# Patient Record
Sex: Female | Born: 1970 | Race: White | Hispanic: No | Marital: Single | Smoking: Never smoker
Health system: Southern US, Community
[De-identification: ages and names within clinical notes are randomized; demographics above are authoritative.]

## PROBLEM LIST (undated history)

## (undated) DIAGNOSIS — Z8489 Family history of other specified conditions: Secondary | ICD-10-CM

## (undated) DIAGNOSIS — J342 Deviated nasal septum: Secondary | ICD-10-CM

## (undated) DIAGNOSIS — G40009 Localization-related (focal) (partial) idiopathic epilepsy and epileptic syndromes with seizures of localized onset, not intractable, without status epilepticus: Secondary | ICD-10-CM

## (undated) HISTORY — PX: NASAL SEPTOPLASTY W/ TURBINOPLASTY: SHX2070

## (undated) HISTORY — PX: WISDOM TOOTH EXTRACTION: SHX21

---

## 1998-09-01 ENCOUNTER — Other Ambulatory Visit: Admission: RE | Admit: 1998-09-01 | Discharge: 1998-09-01 | Payer: Self-pay | Admitting: Obstetrics and Gynecology

## 1999-01-29 ENCOUNTER — Other Ambulatory Visit: Admission: RE | Admit: 1999-01-29 | Discharge: 1999-01-29 | Payer: Self-pay | Admitting: Obstetrics and Gynecology

## 1999-08-18 ENCOUNTER — Inpatient Hospital Stay (HOSPITAL_COMMUNITY): Admission: AD | Admit: 1999-08-18 | Discharge: 1999-08-20 | Payer: Self-pay | Admitting: Obstetrics and Gynecology

## 1999-08-19 ENCOUNTER — Encounter: Payer: Self-pay | Admitting: Obstetrics and Gynecology

## 1999-09-22 ENCOUNTER — Other Ambulatory Visit: Admission: RE | Admit: 1999-09-22 | Discharge: 1999-09-22 | Payer: Self-pay | Admitting: Obstetrics and Gynecology

## 2000-10-07 ENCOUNTER — Other Ambulatory Visit: Admission: RE | Admit: 2000-10-07 | Discharge: 2000-10-07 | Payer: Self-pay | Admitting: Obstetrics and Gynecology

## 2018-11-01 ENCOUNTER — Ambulatory Visit: Payer: BC Managed Care – PPO | Admitting: Allergy

## 2018-11-01 ENCOUNTER — Encounter: Payer: Self-pay | Admitting: Allergy

## 2018-11-01 VITALS — BP 110/72 | HR 86 | Temp 98.2°F | Resp 20 | Ht 64.0 in | Wt 208.2 lb

## 2018-11-01 DIAGNOSIS — J3089 Other allergic rhinitis: Secondary | ICD-10-CM | POA: Diagnosis not present

## 2018-11-01 DIAGNOSIS — H6993 Unspecified Eustachian tube disorder, bilateral: Secondary | ICD-10-CM

## 2018-11-01 DIAGNOSIS — R05 Cough: Secondary | ICD-10-CM | POA: Diagnosis not present

## 2018-11-01 DIAGNOSIS — H6983 Other specified disorders of Eustachian tube, bilateral: Secondary | ICD-10-CM

## 2018-11-01 DIAGNOSIS — R059 Cough, unspecified: Secondary | ICD-10-CM

## 2018-11-01 DIAGNOSIS — J310 Chronic rhinitis: Secondary | ICD-10-CM | POA: Insufficient documentation

## 2018-11-01 MED ORDER — LEVALBUTEROL TARTRATE 45 MCG/ACT IN AERO: INHALATION_SPRAY | RESPIRATORY_TRACT | 1 refills | Status: DC

## 2018-11-01 MED ORDER — PANTOPRAZOLE SODIUM 40 MG PO TBEC: 40.0000 mg | DELAYED_RELEASE_TABLET | Freq: Every day | ORAL | 2 refills | Status: DC

## 2018-11-01 NOTE — Progress Notes (Signed)
New Patient Note  RE: Susan Jefferson MRN: [id number] DOB: 1971/06/02 Date of Office Visit: 11/01/2018  Referring provider: No ref. provider found Primary care provider: Patient, No Pcp Per  Chief Complaint: New Patient (Initial Visit) (?allergic asthma ) and Cough (8 months out of yr )  History of Present Illness: I had the pleasure of seeing Shanitra Phillippi for initial evaluation at the [facility] on 11/02/2018. She is a 47 y.o. female, who is self-referred here for the evaluation of allergies and coughing.   Coughing:  She reports symptoms of chest tightness, shortness of breath, coughing, wheezing for 20 years. This happens from fall through spring but is symptom free during the summer. Current medications include albuterol prn which help but cause palpitations. She reports not using aerochamber with inhalers. She tried the following inhalers: none. Main triggers are unknown but better at night and worse after meals. No history of reflux and never been on any heartburn medication.  In the last month, frequency of symptoms: daily. Frequency of nocturnal symptoms: 0x/month. Frequency of SABA use: daily but it causes palpitations. Interference with physical activity: yes. Sleep is undisturbed. In the last 12 months, emergency room visits/urgent care visits/doctor office visits or hospitalizations due to respiratory issues: once. In the last 12 months, oral steroids courses: no Lifetime history of hospitalization for respiratory issues: no. Prior intubations: no. History of pneumonia: no. She was not evaluated by allergist/pulmonologist in the past. Smoking exposure: no. Up to date with flu vaccine: no.  She reports symptoms of nasal congestion, ear pain, sinus pressure. Symptoms have been going on for 20 years. The symptoms are present fall through spring. Anosmia: yes at times. Headache: yes. She has used zyrtec, Claritin, Flonase 1 spray QD with fair improvement in  symptoms. Sinus infections: no. Previous work up includes: none. Previous ENT evaluation: no. Previous sinus imaging: no.   Assessment and Plan: Ayse is a 47 y.o. female with: Coughing Respiratory symptoms for the 20 years from the fall through spring, symptom free during the summer. Tried albuterol with good benefit but causes palpitations. Worse after meals but better at night. Denies heartburn symptoms.  The most common causes of chronic cough include the following: upper airway cough syndrome (UACS) which is caused by variety of rhinitis conditions; asthma; gastroesophageal reflux disease (GERD); chronic bronchitis from cigarette smoking or other inhaled environmental irritants; non-asthmatic eosinophilic bronchitis; and bronchiectasis. In prospective studies, these conditions have accounted for up to 94% of the causes of chronic cough in immunocompetent adults.  The history and physical examination suggest that her cough is multifactorial. Today's spirometry showed: possible restrictive disease and no improvement post bronchodilator treatment.   Will try to get xopenex prior authorized. Patient had palpitations with albuterol.   May use rescue inhaler 2 puffs or nebulizer every 4 to 6 hours as needed for shortness of breath, chest tightness, coughing, and wheezing. May use rescue inhaler 2 puffs 5 to 15 minutes prior to strenuous physical activities.  Start Protonix 40mg  daily and provided materials on lifestyle modifications regarding heartburn and focus on minimizing spicy food intake.  Other allergic rhinitis Rhinitis symptoms for the past 20 years from fall through spring. Tried zyrtec, Claritin and Flonase with some benefit. No previous ENT evaluation.   Today's testing was negative to environmental allergies including the positive control questioning the validity of the results.  Get bloodwork and will make additional recommendations based on results.   Restart Flonase 2  sprays daily.  May use over the counter antihistamines such as Zyrtec (cetirizine), Claritin (loratadine), Allegra (fexofenadine), or Xyzal (levocetirizine) daily as needed.  Eustachian tube dysfunction, bilateral Patient most likely has eustachian tube dysfunction as well. Sometimes nasal steroid sprays can help with this.  Continue Flonase 2 sprays daily and monitor symptoms.   Return in about 2 months (around 01/01/2019).  Meds ordered this encounter  Medications  . pantoprazole (PROTONIX) 40 MG tablet    Sig: Take 1 tablet (40 mg total) by mouth daily.    Dispense:  30 tablet    Refill:  2  . levalbuterol (XOPENEX HFA) 45 MCG/ACT inhaler    Sig: Use 2 puffs every 4-6 hours as needed for shortness of breath, chest tightness, cough or wheezing    Dispense:  15 g    Refill:  1    Lab Orders     Allergens Zone 2  Other allergy screening: Food allergy: no Medication allergy: no Hymenoptera allergy: no Urticaria: maybe Eczema:no History of recurrent infections suggestive of immunodeficency: no  Diagnostics: Spirometry:  Tracings reviewed. Her effort: It was hard to get consistent efforts and there is a question as to whether this reflects a maximal maneuver. FVC: 2.43L FEV1: 2.16L, 74% predicted FEV1/FVC ratio: 89% Interpretation: Spirometry consistent with possible restrictive disease and no improvement post bronchodilator treatment.  Please see scanned spirometry results for details.  Skin Testing: Environmental allergy panel. Negative test to: environmental allergies including positive control questioning validity of the results.  Results discussed with patient/family. Airborne Adult Perc - 11/01/18 1453    Time Antigen Placed  1450    Allergen Manufacturer  Waynette Buttery    Location  Back    Number of Test  59    Panel 1  Select    1. Control-Buffer 50% Glycerol  Negative    2. Control-Histamine 1 mg/ml  Negative    3. Albumin saline  Negative    4. Bahia  Negative     5. French Southern Territ[place]  Negative    6. Johnson  Negative    7. Kentucky Blue  Negative    8. Meadow Fescue  Negative    9. Perennial Rye  Negative    10. Sweet Vernal  Negative    11. Timothy  Negative    12. Cocklebur  Negative    13. Burweed Marshelder  Negative    14. Ragweed, short  Negative    15. Ragweed, Giant  Negative    16. Plantain,  English  Negative    17. Lamb's Quarters  Negative    18. Sheep Sorrell  Negative    19. Rough Pigweed  Negative    20. Marsh Elder, Rough  Negative    21. Mugwort, Common  Negative    22. Ash mix  Negative    23. Birch mix  Negative    24. Beech American  Negative    25. Box, Elder  Negative    26. Cedar, red  Negative    27. Cottonwood, Guinea-Bissau  Negative    28. Elm mix  Negative    29. Hickory mix  Negative    30. Maple mix  Negative    31. Oak, Guinea-Bissau mix  Negative    32. Pecan Pollen  Negative    33. Pine mix  Negative    34. Sycamore Eastern  Negative    35. Walnut, Black Pollen  Negative    36. Alternaria alternata  Negative    37. Cladosporium Herbarum  Negative  38. Aspergillus mix  Negative    39. Penicillium mix  Negative    40. Bipolaris sorokiniana (Helminthosporium)  Negative    41. Drechslera spicifera (Curvularia)  Negative    42. Mucor plumbeus  Negative    43. Fusarium moniliforme  Negative    44. Aureobasidium pullulans (pullulara)  Negative    45. Rhizopus oryzae  Negative    46. Botrytis cinera  Negative    47. Epicoccum nigrum  Negative    48. Phoma betae  Negative    49. Candida Albicans  Negative    50. Trichophyton mentagrophytes  Negative    51. Mite, D Farinae  5,000 AU/ml  Negative    52. Mite, D Pteronyssinus  5,000 AU/ml  Negative    53. Cat Hair 10,000 BAU/ml  Negative    54.  Dog Epithelia  Negative    55. Mixed Feathers  Negative    56. Horse Epithelia  Negative    57. Cockroach, German  Negative    58. Mouse  Negative    59. Tobacco Leaf  Negative       Past Medical History: Patient Active  Problem List   Diagnosis Date Noted  . Eustachian tube dysfunction, bilateral 11/02/2018  . Coughing 11/01/2018  . Other allergic rhinitis 11/01/2018   History reviewed. No pertinent past medical history. Past Surgical History: History reviewed. No pertinent surgical history. Medication List:  Current Outpatient Medications  Medication Sig Dispense Refill  . albuterol (PROVENTIL HFA;VENTOLIN HFA) 108 (90 Base) MCG/ACT inhaler INHALE 1 TO 2 PUFFS PO Q 4 TO 8 H PRF WHEEZING.  0  . escitalopram (LEXAPRO) 10 MG tablet TK 1 T PO QD  3  . fluticasone (FLONASE) 50 MCG/ACT nasal spray Place into both nostrils daily.    . Loratadine (CLARITIN PO) Take 10 mg by mouth.    . mirtazapine (REMERON) 15 MG tablet     . levalbuterol (XOPENEX HFA) 45 MCG/ACT inhaler Use 2 puffs every 4-6 hours as needed for shortness of breath, chest tightness, cough or wheezing 15 g 1  . pantoprazole (PROTONIX) 40 MG tablet Take 1 tablet (40 mg total) by mouth daily. 30 tablet 2   No current facility-administered medications for this visit.    Allergies: No Known Allergies Social History: Social History   Socioeconomic History  . Marital status: Married    Spouse name: Not on file  . Number of children: Not on file  . Years of education: Not on file  . Highest education level: Not on file  Occupational History  . Not on file  Social Needs  . Financial resource strain: Not on file  . Food insecurity:    Worry: Not on file    Inability: Not on file  . Transportation needs:    Medical: Not on file    Non-medical: Not on file  Tobacco Use  . Smoking status: Never Smoker  . Smokeless tobacco: Never Used  Substance and Sexual Activity  . Alcohol use: Never    Frequency: Never  . Drug use: Never  . Sexual activity: Not on file  Lifestyle  . Physical activity:    Days per week: Not on file    Minutes per session: Not on file  . Stress: Not on file  Relationships  . Social connections:    Talks on  phone: Not on file    Gets together: Not on file    Attends religious service: Not on file    Active  member of club or organization: Not on file    Attends meetings of clubs or organizations: Not on file    Relationship status: Not on file  Other Topics Concern  . Not on file  Social History Narrative  . Not on file   Lives in a 546 month old townhome. Smoking: denies Occupation: Adult nurseteacher  Environmental HistorySurveyor, minerals: Water Damage/mildew in the house: no Engineer, civil (consulting)Carpet in the family room: no Carpet in the bedroom: yes Heating: gas Cooling: central Pet: yes 2 dogs x 11 yrs, 3-4 yrs  Family History: Family History  Problem Relation Age of Onset  . Allergic rhinitis Son   . Asthma Neg Hx   . Eczema Neg Hx   . Urticaria Neg Hx    Problem                               Relation Asthma                                   No  Eczema                                No  Food allergy                          No  Allergic rhino conjunctivitis     Son   Review of Systems  Constitutional: Negative for appetite change, chills, fever and unexpected weight change.  HENT: Positive for congestion and sinus pressure. Negative for rhinorrhea.   Eyes: Negative for itching.  Respiratory: Positive for cough, chest tightness, shortness of breath and wheezing.   Cardiovascular: Negative for chest pain.  Gastrointestinal: Negative for abdominal pain.  Genitourinary: Negative for difficulty urinating.  Skin: Negative for rash.  Allergic/Immunologic: Positive for environmental allergies. Negative for food allergies.  Neurological: Positive for headaches.   Objective: BP 110/72 (BP Location: Left Arm, Patient Position: Sitting, Cuff Size: Normal)   Pulse 86   Temp 98.2 F (36.8 C) (Oral)   Resp 20   Ht 5\' 4"  (1.626 m)   Wt 208 lb 3.2 oz (94.4 kg)   SpO2 96%   BMI 35.74 kg/m  Body mass index is 35.74 kg/m. Physical Exam  Constitutional: She is oriented to person, place, and time. She appears  well-developed and well-nourished.  HENT:  Head: Normocephalic and atraumatic.  Right Ear: External ear normal.  Left Ear: External ear normal.  Nose: Nose normal.  Mouth/Throat: Oropharynx is clear and moist.  Eyes: Conjunctivae and EOM are normal.  Neck: Neck supple.  Cardiovascular: Normal rate, regular rhythm and normal heart sounds. Exam reveals no gallop and no friction rub.  No murmur heard. Pulmonary/Chest: Effort normal and breath sounds normal. She has no wheezes. She has no rales.  Abdominal: Soft.  Lymphadenopathy:    She has no cervical adenopathy.  Neurological: She is alert and oriented to person, place, and time.  Skin: Skin is warm. No rash noted.  Psychiatric: She has a normal mood and affect. Her behavior is normal.  Nursing note and vitals reviewed.  The plan was reviewed with the patient/family, and all questions/concerned were addressed.  It was my pleasure to see Imelda today and participate in her care. Please feel free to contact me with any  questions or concerns.  Sincerely,  Wyline Mood, DO Allergy & Immunology  [facility] office: [phone] [facility] office:[phone]

## 2018-11-01 NOTE — Patient Instructions (Addendum)
Today's testing was negative to environmental allergies including the positive control questioning the validity of the results.  Get bloodwork and will make additional recommendations based on results.   Will try to get xopenex prior authorized. Patient had palpitations with albuterol.  May use rescue inhaler 2 puffs or nebulizer every 4 to 6 hours as needed for shortness of breath, chest tightness, coughing, and wheezing. May use rescue inhaler 2 puffs 5 to 15 minutes prior to strenuous physical activities.  Restart Flonase 2 sprays daily. May use over the counter antihistamines such as Zyrtec (cetirizine), Claritin (loratadine), Allegra (fexofenadine), or Xyzal (levocetirizine) daily as needed.  Take protonix 40mg  daily.   Follow up in 2 months

## 2018-11-02 DIAGNOSIS — H6983 Other specified disorders of Eustachian tube, bilateral: Secondary | ICD-10-CM | POA: Insufficient documentation

## 2018-11-02 NOTE — Assessment & Plan Note (Signed)
Rhinitis symptoms for the past 20 years from fall through spring. Tried zyrtec, Claritin and Flonase with some benefit. No previous ENT evaluation.   Today's testing was negative to environmental allergies including the positive control questioning the validity of the results.  Get bloodwork and will make additional recommendations based on results.   Restart Flonase 2 sprays daily.  May use over the counter antihistamines such as Zyrtec (cetirizine), Claritin (loratadine), Allegra (fexofenadine), or Xyzal (levocetirizine) daily as needed.

## 2018-11-02 NOTE — Assessment & Plan Note (Signed)
Patient most likely has eustachian tube dysfunction as well. Sometimes nasal steroid sprays can help with this.  Continue Flonase 2 sprays daily and monitor symptoms.

## 2018-11-02 NOTE — Assessment & Plan Note (Signed)
Respiratory symptoms for the 20 years from the fall through spring, symptom free during the summer. Tried albuterol with good benefit but causes palpitations. Worse after meals but better at night. Denies heartburn symptoms.  The most common causes of chronic cough include the following: upper airway cough syndrome (UACS) which is caused by variety of rhinitis conditions; asthma; gastroesophageal reflux disease (GERD); chronic bronchitis from cigarette smoking or other inhaled environmental irritants; non-asthmatic eosinophilic bronchitis; and bronchiectasis. In prospective studies, these conditions have accounted for up to 94% of the causes of chronic cough in immunocompetent adults.  The history and physical examination suggest that her cough is multifactorial. Today's spirometry showed: possible restrictive disease and no improvement post bronchodilator treatment.   Will try to get xopenex prior authorized. Patient had palpitations with albuterol.   May use rescue inhaler 2 puffs or nebulizer every 4 to 6 hours as needed for shortness of breath, chest tightness, coughing, and wheezing. May use rescue inhaler 2 puffs 5 to 15 minutes prior to strenuous physical activities.  Start Protonix 40mg  daily and provided materials on lifestyle modifications regarding heartburn and focus on minimizing spicy food intake.

## 2018-11-04 LAB — IGE+ALLERGENS ZONE 2(30)
Alternaria Alternata IgE: <0.1 kU/L
Amer Sycamore IgE Qn: <0.1 kU/L
Aspergillus Fumigatus IgE: <0.1 kU/L
Bahia Grass IgE: <0.1 kU/L
Bermuda Grass IgE: <0.1 kU/L
Cat Dander IgE: <0.1 kU/L
Cedar, Mountain IgE: <0.1 kU/L
Cladosporium Herbarum IgE: <0.1 kU/L
Cockroach, American IgE: <0.1 kU/L
Common Silver Birch IgE: <0.1 kU/L
D Farinae IgE: <0.1 kU/L
D Pteronyssinus IgE: <0.1 kU/L
Dog Dander IgE: <0.1 kU/L
Elm, American IgE: <0.1 kU/L
Hickory, White IgE: <0.1 kU/L
IgE (Immunoglobulin E), Serum: <2 IU/mL — ABNORMAL LOW (ref 6–495)
Johnson Grass IgE: <0.1 kU/L
Maple/Box Elder IgE: <0.1 kU/L
Mucor Racemosus IgE: <0.1 kU/L
Mugwort IgE Qn: <0.1 kU/L
Nettle IgE: <0.1 kU/L
Oak, White IgE: <0.1 kU/L
Penicillium Chrysogen IgE: <0.1 kU/L
Pigweed, Rough IgE: <0.1 kU/L
Plantain, English IgE: <0.1 kU/L
Ragweed, Short IgE: <0.1 kU/L
Sheep Sorrel IgE Qn: <0.1 kU/L
Stemphylium Herbarum IgE: <0.1 kU/L
Sweet gum IgE RAST Ql: <0.1 kU/L
Timothy Grass IgE: <0.1 kU/L
White Mulberry IgE: <0.1 kU/L

## 2018-12-07 ENCOUNTER — Other Ambulatory Visit: Payer: Self-pay | Admitting: Internal Medicine

## 2018-12-07 DIAGNOSIS — Z1231 Encounter for screening mammogram for malignant neoplasm of breast: Secondary | ICD-10-CM

## 2018-12-08 ENCOUNTER — Ambulatory Visit
Admission: RE | Admit: 2018-12-08 | Discharge: 2018-12-08 | Disposition: A | Discharge status: Home / Self Care | Payer: BC Managed Care – PPO | Source: Ambulatory Visit | Attending: Internal Medicine | Admitting: Internal Medicine

## 2018-12-08 ENCOUNTER — Other Ambulatory Visit: Payer: Self-pay | Admitting: Internal Medicine

## 2018-12-08 DIAGNOSIS — R0600 Dyspnea, unspecified: Secondary | ICD-10-CM

## 2018-12-08 DIAGNOSIS — R05 Cough: Secondary | ICD-10-CM

## 2018-12-08 DIAGNOSIS — R059 Cough, unspecified: Secondary | ICD-10-CM

## 2019-01-01 ENCOUNTER — Encounter: Payer: Self-pay | Admitting: Allergy

## 2019-01-01 ENCOUNTER — Telehealth: Payer: Self-pay

## 2019-01-01 ENCOUNTER — Ambulatory Visit: Payer: BC Managed Care – PPO | Admitting: Allergy

## 2019-01-01 VITALS — BP 130/82 | HR 100 | Resp 16

## 2019-01-01 DIAGNOSIS — R059 Cough, unspecified: Secondary | ICD-10-CM

## 2019-01-01 DIAGNOSIS — J0100 Acute maxillary sinusitis, unspecified: Secondary | ICD-10-CM | POA: Insufficient documentation

## 2019-01-01 DIAGNOSIS — J310 Chronic rhinitis: Secondary | ICD-10-CM

## 2019-01-01 DIAGNOSIS — R05 Cough: Secondary | ICD-10-CM | POA: Diagnosis not present

## 2019-01-01 MED ORDER — FLUTICASONE PROPIONATE 93 MCG/ACT NA EXHU: 2.0000 | INHALANT_SUSPENSION | Freq: Two times a day (BID) | NASAL | 3 refills | Status: DC

## 2019-01-01 MED ORDER — AMOXICILLIN-POT CLAVULANATE 875-125 MG PO TABS: 1.0000 | ORAL_TABLET | Freq: Two times a day (BID) | ORAL | 0 refills | Status: DC

## 2019-01-01 NOTE — Assessment & Plan Note (Addendum)
Past history - Rhinitis symptoms for the past 20 years from fall through spring. Tried zyrtec, Claritin and Flonase with some benefit. No previous ENT evaluation. 2019 testing was negative to environmental allergies including the positive control questioning the validity of the results. Interim history - 2019 immunocap was negative to environmental allergies as well. Still having same symptoms with discolored thick mucous. Did not tolerate nettipot in the past.   Question if she has a sinus infection.   Start Augmentin twice a day.   Start xhance 2 sprays twice daily - sample given, demonstrated proper use. This replaces Flonase.   Start prednisone taper.   Nasal saline spray (i.e., Simply Saline) or nasal saline lavage (i.e., NeilMed) is recommended as needed and prior to medicated nasal sprays.  Refer to ENT to rule out anatomical/structural abnormalities.   May continue Claritin daily if needed.

## 2019-01-01 NOTE — Assessment & Plan Note (Signed)
Past history - Respiratory symptoms for the 20 years from the fall through spring, symptom free during the summer. Tried albuterol with good benefit but causes palpitations. Worse after meals but better at night. Denies heartburn symptoms.  Interim history - Unchanged. Tried Protonix for 1 month with no improvement. Xopenex does not cause palpitations but does not sem to work either.  Hopefully symptoms will improve with Augmentin and prednisone. If not then consider obtaining CXR at next visit.   Monitor symptoms.

## 2019-01-01 NOTE — Patient Instructions (Addendum)
Non-allergic rhinitis  Start xhance 2 sprays twice daily - sample given, demonstrated proper use  Start prednisone taper.   Nasal saline spray (i.e., Simply Saline) or nasal saline lavage (i.e., NeilMed) is recommended as needed and prior to medicated nasal sprays.  Start Augmentin twice a day.   Refer to ENT  Follow up in 2 months

## 2019-01-01 NOTE — Assessment & Plan Note (Signed)
.   See assessment and plan as above. 

## 2019-01-01 NOTE — Progress Notes (Signed)
Follow Up Note  RE: Susan Jefferson Burnside MRN: [id number] DOB: 1971/01/01 Date of Office Visit: 01/01/2019  Referring provider: No ref. provider found Primary care provider: Renford DillsPolite, Ronald, MD  Chief Complaint: Follow-up  History of Present Illness: I had the pleasure of seeing Susan Jefferson Zuccaro for a follow up visit at the [facility] on 01/01/2019. She is a 48 y.o. female, who is being followed for coughing, allergic rhinitis, eustachian tube dysfunction. Today she is here for regular follow up visit and not feeling better. Her previous allergy office visit was on 11/01/2018 with Dr. Selena BattenKim.   Coughing Patient took Protonix for 4 weeks with no benefit.  Tried xopenex  With no benefit and it caused a coughing fits. Didn't seem to work as well as albuterol.   Other allergic rhinitis Taking Claritin which helped the rash. Still having issus with sense of taste and smell. Increased mucous production which is thick and colored. No fevers or chills.  Still happens from fall through spring and symptom free in the summer. Still taking Flonase but no much benefit. Nettipot seemed to worsen symptoms.   Assessment and Plan: [name] is a 48 y.o. female with: Non-allergic rhinitis Past history - Rhinitis symptoms for the past 20 years from fall through spring. Tried zyrtec, Claritin and Flonase with some benefit. No previous ENT evaluation. 2019 testing was negative to environmental allergies including the positive control questioning the validity of the results. Interim history - 2019 immunocap was negative to environmental allergies as well. Still having same symptoms with discolored thick mucous. Did not tolerate nettipot in the past.   Question if she has a sinus infection.   Start Augmentin twice a day.   Start xhance 2 sprays twice daily - sample given, demonstrated proper use. This replaces Flonase.   Start prednisone taper.   Nasal saline spray (i.e., Simply Saline) or  nasal saline lavage (i.e., NeilMed) is recommended as needed and prior to medicated nasal sprays.  Refer to ENT to rule out anatomical/structural abnormalities.   May continue Claritin daily if needed.  Acute maxillary sinusitis See assessment and plan as above.  Coughing Past history - Respiratory symptoms for the 20 years from the fall through spring, symptom free during the summer. Tried albuterol with good benefit but causes palpitations. Worse after meals but better at night. Denies heartburn symptoms.  Interim history - Unchanged. Tried Protonix for 1 month with no improvement. Xopenex does not cause palpitations but does not sem to work either.  Hopefully symptoms will improve with Augmentin and prednisone. If not then consider obtaining CXR at next visit.   Monitor symptoms.   Return in about 2 months (around 03/02/2019).  Meds ordered this encounter  Medications  . amoxicillin-clavulanate (AUGMENTIN) 875-125 MG tablet    Sig: Take 1 tablet by mouth 2 (two) times daily.    Dispense:  20 tablet    Refill:  0  . Fluticasone Propionate (XHANCE) 93 MCG/ACT EXHU    Sig: Place 2 sprays into the nose 2 (two) times daily.    Dispense:  16 mL    Refill:  3   Diagnostics: None.  Medication List:  Current Outpatient Medications  Medication Sig Dispense Refill  . escitalopram (LEXAPRO) 10 MG tablet TK 1 T PO QD  3  . Loratadine (CLARITIN PO) Take 10 mg by mouth.    . mirtazapine (REMERON) 15 MG tablet     . albuterol (PROVENTIL HFA;VENTOLIN HFA) 108 (90 Base) MCG/ACT inhaler  INHALE 1 TO 2 PUFFS PO Q 4 TO 8 H PRF WHEEZING.  0  . amoxicillin-clavulanate (AUGMENTIN) 875-125 MG tablet Take 1 tablet by mouth 2 (two) times daily. 20 tablet 0  . Fluticasone Propionate (XHANCE) 93 MCG/ACT EXHU Place 2 sprays into the nose 2 (two) times daily. 16 mL 3  . levalbuterol (XOPENEX HFA) 45 MCG/ACT inhaler Use 2 puffs every 4-6 hours as needed for shortness of breath, chest tightness, cough or  wheezing (Patient not taking: Reported on 01/01/2019) 15 g 1   No current facility-administered medications for this visit.    Allergies: No Known Allergies I reviewed her past medical history, social history, family history, and environmental history and no significant changes have been reported from previous visit on 11/01/2018.  Review of Systems  Constitutional: Negative for appetite change, chills, fever and unexpected weight change.  HENT: Positive for congestion and sinus pressure. Negative for rhinorrhea.   Eyes: Negative for itching.  Respiratory: Positive for cough, chest tightness and shortness of breath. Negative for wheezing.   Cardiovascular: Negative for chest pain.  Gastrointestinal: Negative for abdominal pain.  Genitourinary: Negative for difficulty urinating.  Skin: Negative for rash.  Allergic/Immunologic: Negative for environmental allergies and food allergies.  Neurological: Positive for headaches.   Objective: BP 130/82 (BP Location: Left Arm)   Pulse 100   Resp 16   SpO2 95%  There is no height or weight on file to calculate BMI. Physical Exam  Constitutional: She is oriented to person, place, and time. She appears well-developed and well-nourished.  HENT:  Head: Normocephalic and atraumatic.  Right Ear: External ear normal.  Left Ear: External ear normal.  Nose: Nose normal.  Mouth/Throat: Oropharynx is clear and moist.  Eyes: Conjunctivae and EOM are normal.  Neck: Neck supple.  Cardiovascular: Normal rate, regular rhythm and normal heart sounds. Exam reveals no gallop and no friction rub.  No murmur heard. Pulmonary/Chest: Effort normal and breath sounds normal. She has no wheezes. She has no rales.  Abdominal: Soft.  Lymphadenopathy:    She has no cervical adenopathy.  Neurological: She is alert and oriented to person, place, and time.  Skin: Skin is warm. No rash noted.  Psychiatric: She has a normal mood and affect. Her behavior is normal.    Nursing note and vitals reviewed.  Previous notes and tests were reviewed. The plan was reviewed with the patient/family, and all questions/concerned were addressed.  It was my pleasure to see Niamh today and participate in her care. Please feel free to contact me with any questions or concerns.  Sincerely,  Wyline Mood, DO Allergy & Immunology  [facility] office: [phone] [facility] office: [phone]

## 2019-01-01 NOTE — Telephone Encounter (Signed)
Can you place a referral to ENT?

## 2019-01-02 NOTE — Telephone Encounter (Signed)
Referral has been placed. 

## 2019-01-05 ENCOUNTER — Ambulatory Visit
Admission: RE | Admit: 2019-01-05 | Discharge: 2019-01-05 | Disposition: A | Discharge status: Home / Self Care | Payer: BC Managed Care – PPO | Source: Ambulatory Visit | Attending: Internal Medicine | Admitting: Internal Medicine

## 2019-01-05 DIAGNOSIS — Z1231 Encounter for screening mammogram for malignant neoplasm of breast: Secondary | ICD-10-CM

## 2019-03-01 ENCOUNTER — Ambulatory Visit (INDEPENDENT_AMBULATORY_CARE_PROVIDER_SITE_OTHER): Payer: BC Managed Care – PPO | Admitting: Allergy

## 2019-03-01 ENCOUNTER — Telehealth: Payer: Self-pay | Admitting: Allergy

## 2019-03-01 ENCOUNTER — Encounter: Payer: Self-pay | Admitting: Allergy

## 2019-03-01 ENCOUNTER — Other Ambulatory Visit: Payer: Self-pay

## 2019-03-01 DIAGNOSIS — J0100 Acute maxillary sinusitis, unspecified: Secondary | ICD-10-CM | POA: Diagnosis not present

## 2019-03-01 DIAGNOSIS — J310 Chronic rhinitis: Secondary | ICD-10-CM

## 2019-03-01 MED ORDER — PREDNISONE 10 MG PO TABS: ORAL_TABLET | ORAL | 0 refills | Status: DC

## 2019-03-01 MED ORDER — DOXYCYCLINE HYCLATE 100 MG PO CAPS: 100.0000 mg | ORAL_CAPSULE | Freq: Two times a day (BID) | ORAL | 0 refills | Status: AC

## 2019-03-01 NOTE — Telephone Encounter (Signed)
Patient is set up with a virtual visit with Dr Selena Batten this afternoon at 3:30 pm.

## 2019-03-01 NOTE — Progress Notes (Signed)
RE: Susan Jefferson MRN: [id number] DOB: 1971-05-16 Date of Telemedicine Visit: 03/01/2019  Referring provider: Renford Dills, MD Primary care provider: Renford Dills, MD  Chief Complaint: Nasal Congestion (green,sticky and stringy x since September); Sinus Problem (flonase claritin and sinus medication ); Smell; and Taste   Telemedicine Follow Up Visit via Telephone: I connected with Susan Jefferson for a follow up on 03/01/19 by telephone and verified that I am speaking with the correct person using two identifiers.   I discussed the limitations, risks, security and privacy concerns of performing an evaluation and management service by telephone and the availability of in person appointments. I also discussed with the patient that there may be a patient responsible charge related to this service. The patient expressed understanding and agreed to proceed.  Patient is a passenger in a moving car. Provider is at the office.  Visit start time: 3:29PM Visit end time: 3:45PM Insurance consent/check in by: Susan Jefferson Medical consent and medical assistant/nurse: Bennye Alm  History of Present Illness: She is a 48 y.o. female, who is being followed for coughing, allergic rhinitis, eustachian tube dysfunction. Her previous allergy office visit was on 01/01/2019 with Dr. Selena Batten.   Patient took prednisone and Augmentin in January which helped. She noted that her sense of smell and taste returned with prednisone and once she stopped prednisone the symptoms returned. The Augmentin did help with some of the nasal discharge but it never completely went away.  She called the office today as her symptoms worsened yesterday with ears are popping, feeling nauseous and sinus pressure. She called ENT to move up the appointment but was told due to the coronavirus pandemic they are actually not seeing new patients until June. She can't wait until then as her symptoms are worsening.   She is also having  green/sticky discharge and taking Mucinex for the sinus pressure with some benefit. Denies any fevers or chills.  Stopped doing [place] as she felt like the medication was not going up her sinuses.   Assessment and Plan: [name] is a 48 y.o. female with: Acute maxillary sinusitis Past history - Rhinitis symptoms for the past 20 years from fall through spring. Tried zyrtec, Claritin and Flonase with some benefit. No previous ENT evaluation. 2019 testing was negative to environmental allergies including the positive control questioning the validity of the results. 2019 immunocap was negative to environmental allergies as well.  Interim history - symptoms improved while on prednisone and Augmentin did help but once she completed medications all her symptoms returned. Unable to see ENT until June due to Coronavirus pandemic.  She most likely has another sinus infection.  Start doxycycline 100mg  twice a day for 10 days.  Start prednisone taper.   Start xhance 2 sprays twice daily.  Nasal saline spray (i.e., Simply Saline) or nasal saline lavage (i.e., NeilMed) is recommended as needed and prior to medicated nasal sprays.  May continue Claritin daily if needed.  May use mucinex twice a day with plenty of water to help with loosening up the discharge.   Follow up with ENT as scheduled.  Return in about 3 months (around 06/01/2019).  Meds ordered this encounter  Medications  . doxycycline (VIBRAMYCIN) 100 MG capsule    Sig: Take 1 capsule (100 mg total) by mouth 2 (two) times daily for 10 days.    Dispense:  20 capsule    Refill:  0  . predniSONE (DELTASONE) 10 MG tablet    Sig: Take prednisone 40mg  daily x  2 days, 30mg  daily x 2 days, 20mg  daily x 2 days and 10mg  daily x 2 days.    Dispense:  20 tablet    Refill:  0    Dx: acute sinusitis   Diagnostics: None.  Medication List:  Current Outpatient Medications  Medication Sig Dispense Refill  . albuterol (PROVENTIL HFA;VENTOLIN  HFA) 108 (90 Base) MCG/ACT inhaler INHALE 1 TO 2 PUFFS PO Q 4 TO 8 H PRF WHEEZING.  0  . escitalopram (LEXAPRO) 10 MG tablet TK 1 T PO QD  3  . fluticasone (FLONASE) 50 MCG/ACT nasal spray Place into both nostrils daily.    . Loratadine (CLARITIN PO) Take 10 mg by mouth.    . mirtazapine (REMERON) 15 MG tablet     . Phenylephrine-DM-GG-APAP (MUCINEX SINUS-MAX) 5-10-200-325 MG CAPS Take by mouth.    . doxycycline (VIBRAMYCIN) 100 MG capsule Take 1 capsule (100 mg total) by mouth 2 (two) times daily for 10 days. 20 capsule 0  . Fluticasone Propionate (XHANCE) 93 MCG/ACT EXHU Place 2 sprays into the nose 2 (two) times daily. (Patient not taking: Reported on 03/01/2019) 16 mL 3  . levalbuterol (XOPENEX HFA) 45 MCG/ACT inhaler Use 2 puffs every 4-6 hours as needed for shortness of breath, chest tightness, cough or wheezing (Patient not taking: Reported on 03/01/2019) 15 g 1  . predniSONE (DELTASONE) 10 MG tablet Take prednisone 40mg  daily x 2 days, 30mg  daily x 2 days, 20mg  daily x 2 days and 10mg  daily x 2 days. 20 tablet 0   No current facility-administered medications for this visit.    Allergies: No Known Allergies I reviewed her past medical history, social history, family history, and environmental history and no significant changes have been reported from previous visit on 01/01/2019.  Review of Systems  Constitutional: Negative for appetite change, chills, fever and unexpected weight change.  HENT: Positive for congestion, sinus pressure and sinus pain. Negative for rhinorrhea.   Eyes: Negative for itching.  Respiratory: Negative for wheezing.   Cardiovascular: Negative for chest pain.  Gastrointestinal: Negative for abdominal pain.  Genitourinary: Negative for difficulty urinating.  Skin: Negative for rash.  Allergic/Immunologic: Negative for environmental allergies and food allergies.  Neurological: Positive for headaches.   Objective: Physical Exam Not obtained as encounter was done  via telephone.   Previous notes and tests were reviewed.  I discussed the assessment and treatment plan with the patient. The patient was provided an opportunity to ask questions and all were answered. The patient agreed with the plan and demonstrated an understanding of the instructions. Patient instructions were emailed to patient.    The patient was advised to call back or seek an in-person evaluation if the symptoms worsen or if the condition fails to improve as anticipated.  I provided 16 minutes of non-face-to-face time during this encounter.  It was my pleasure to participate in Dorchester Wolden's care today. Please feel free to contact me with any questions or concerns.   Sincerely,  Wyline Mood, DO Allergy & Immunology  [facility] office: [phone] [facility] office: [phone]

## 2019-03-01 NOTE — Patient Instructions (Addendum)
   Start antibiotics called doxycycline 1 pill twice a day for 10 days.  Start prednisone taper.   Start xhance 2 sprays twice daily.  Nasal saline spray (i.e., Simply Saline) or nasal saline lavage (i.e., NeilMed) is recommended as needed and prior to medicated nasal sprays.  May continue Claritin daily if needed.  May use mucinex twice a day with plenty of water to help with loosening up the discharge.   Follow up with ENT as scheduled.  Follow up with me after ENT visit.

## 2019-03-01 NOTE — Telephone Encounter (Signed)
Patient is calling about some medication that she was given in the past that stopped her sinus drainage Patient cannot remember the name of the medication Her ENT has closed until June and she will not be able to see them for her referral Please call patient about medication  Patient uses [facility] on [address]

## 2019-03-01 NOTE — Assessment & Plan Note (Signed)
Past history - Rhinitis symptoms for the past 20 years from fall through spring. Tried zyrtec, Claritin and Flonase with some benefit. No previous ENT evaluation. 2019 testing was negative to environmental allergies including the positive control questioning the validity of the results. 2019 immunocap was negative to environmental allergies as well.  Interim history - symptoms improved while on prednisone and Augmentin did help but once she completed medications all her symptoms returned. Unable to see ENT until June due to Coronavirus pandemic.  She most likely has another sinus infection.  Start doxycycline 100mg  twice a day for 10 days.  Start prednisone taper.   Start xhance 2 sprays twice daily.  Nasal saline spray (i.e., Simply Saline) or nasal saline lavage (i.e., NeilMed) is recommended as needed and prior to medicated nasal sprays.  May continue Claritin daily if needed.  May use mucinex twice a day with plenty of water to help with loosening up the discharge.   Follow up with ENT as scheduled.

## 2019-03-01 NOTE — Telephone Encounter (Signed)
Called patient states she is having same symptoms as her appt on 12/2018 states she has runny nose sinus pressure dizziness. Denies fever body aches chills. Patient would like a prednisone and antibiotic treatment as this helped before. States her ENT appt got changed due to them closing and wont be seen until June.  Dr Selena Batten please advise

## 2019-03-01 NOTE — Telephone Encounter (Signed)
Can we set up a telephone or e-visit for this patient in the afternoon?

## 2019-03-05 ENCOUNTER — Ambulatory Visit: Payer: BC Managed Care – PPO | Admitting: Allergy

## 2019-05-24 ENCOUNTER — Other Ambulatory Visit: Payer: Self-pay | Admitting: Otolaryngology

## 2019-05-24 DIAGNOSIS — J329 Chronic sinusitis, unspecified: Secondary | ICD-10-CM

## 2019-05-25 ENCOUNTER — Ambulatory Visit
Admission: RE | Admit: 2019-05-25 | Discharge: 2019-05-25 | Disposition: A | Discharge status: Home / Self Care | Payer: BC Managed Care – PPO | Source: Ambulatory Visit | Attending: Otolaryngology | Admitting: Otolaryngology

## 2019-05-25 ENCOUNTER — Other Ambulatory Visit: Payer: Self-pay

## 2019-05-25 DIAGNOSIS — J329 Chronic sinusitis, unspecified: Secondary | ICD-10-CM

## 2019-06-07 ENCOUNTER — Other Ambulatory Visit: Payer: Self-pay | Admitting: Otolaryngology

## 2019-06-14 ENCOUNTER — Encounter (HOSPITAL_BASED_OUTPATIENT_CLINIC_OR_DEPARTMENT_OTHER): Payer: Self-pay | Admitting: *Deleted

## 2019-06-14 ENCOUNTER — Other Ambulatory Visit: Payer: Self-pay

## 2019-06-18 ENCOUNTER — Other Ambulatory Visit (HOSPITAL_COMMUNITY)
Admission: RE | Admit: 2019-06-18 | Discharge: 2019-06-18 | Disposition: A | Discharge status: Home / Self Care | Payer: BC Managed Care – PPO | Source: Ambulatory Visit | Attending: Otolaryngology | Admitting: Otolaryngology

## 2019-06-18 DIAGNOSIS — Z1159 Encounter for screening for other viral diseases: Secondary | ICD-10-CM | POA: Diagnosis not present

## 2019-06-19 LAB — SARS CORONAVIRUS 2 (TAT 6-24 HRS): SARS Coronavirus 2: NEGATIVE

## 2019-06-20 NOTE — Anesthesia Preprocedure Evaluation (Signed)
Anesthesia Evaluation  Patient identified by MRN, date of birth, ID band Patient awake    Reviewed: Allergy & Precautions, NPO status , Patient's Chart, lab work & pertinent test results  History of Anesthesia Complications (+) Family history of anesthesia reaction  Airway Mallampati: II  TM Distance: >3 FB Neck ROM: Full    Dental  (+) Dental Advisory Given   Pulmonary neg pulmonary ROS,    Pulmonary exam normal breath sounds clear to auscultation       Cardiovascular negative cardio ROS Normal cardiovascular exam Rhythm:Regular Rate:Normal     Neuro/Psych Seizures -,  negative psych ROS   GI/Hepatic negative GI ROS, Neg liver ROS,   Endo/Other  negative endocrine ROS  Renal/GU negative Renal ROS     Musculoskeletal negative musculoskeletal ROS (+)   Abdominal   Peds  Hematology negative hematology ROS (+)   Anesthesia Other Findings   Reproductive/Obstetrics negative OB ROS                             Anesthesia Physical Anesthesia Plan  ASA: II  Anesthesia Plan: General   Post-op Pain Management:    Induction: Intravenous  PONV Risk Score and Plan: 4 or greater and Ondansetron, Dexamethasone, Treatment may vary due to age or medical condition and Midazolam  Airway Management Planned: Oral ETT  Additional Equipment:   Intra-op Plan:   Post-operative Plan: Extubation in OR  Informed Consent: I have reviewed the patients History and Physical, chart, labs and discussed the procedure including the risks, benefits and alternatives for the proposed anesthesia with the patient or authorized representative who has indicated his/her understanding and acceptance.     Dental advisory given  Plan Discussed with: CRNA  Anesthesia Plan Comments:         Anesthesia Quick Evaluation

## 2019-06-21 ENCOUNTER — Ambulatory Visit (HOSPITAL_BASED_OUTPATIENT_CLINIC_OR_DEPARTMENT_OTHER)
Admission: RE | Admit: 2019-06-21 | Discharge: 2019-06-21 | Disposition: A | Discharge status: Home / Self Care | Payer: BC Managed Care – PPO | Attending: Otolaryngology | Admitting: Otolaryngology

## 2019-06-21 ENCOUNTER — Encounter (HOSPITAL_BASED_OUTPATIENT_CLINIC_OR_DEPARTMENT_OTHER): Payer: Self-pay | Admitting: Anesthesiology

## 2019-06-21 ENCOUNTER — Encounter (HOSPITAL_BASED_OUTPATIENT_CLINIC_OR_DEPARTMENT_OTHER)
Admission: RE | Disposition: A | Discharge status: Home / Self Care | Payer: Self-pay | Source: Home / Self Care | Attending: Otolaryngology

## 2019-06-21 ENCOUNTER — Ambulatory Visit (HOSPITAL_BASED_OUTPATIENT_CLINIC_OR_DEPARTMENT_OTHER): Payer: BC Managed Care – PPO | Admitting: Anesthesiology

## 2019-06-21 DIAGNOSIS — J320 Chronic maxillary sinusitis: Secondary | ICD-10-CM | POA: Insufficient documentation

## 2019-06-21 DIAGNOSIS — J342 Deviated nasal septum: Secondary | ICD-10-CM | POA: Insufficient documentation

## 2019-06-21 DIAGNOSIS — J343 Hypertrophy of nasal turbinates: Secondary | ICD-10-CM | POA: Insufficient documentation

## 2019-06-21 DIAGNOSIS — J3489 Other specified disorders of nose and nasal sinuses: Secondary | ICD-10-CM | POA: Diagnosis not present

## 2019-06-21 DIAGNOSIS — J322 Chronic ethmoidal sinusitis: Secondary | ICD-10-CM | POA: Insufficient documentation

## 2019-06-21 DIAGNOSIS — J323 Chronic sphenoidal sinusitis: Secondary | ICD-10-CM | POA: Insufficient documentation

## 2019-06-21 DIAGNOSIS — J329 Chronic sinusitis, unspecified: Secondary | ICD-10-CM | POA: Diagnosis present

## 2019-06-21 DIAGNOSIS — J338 Other polyp of sinus: Secondary | ICD-10-CM

## 2019-06-21 HISTORY — PX: SINUS ENDO WITH FUSION: SHX5329

## 2019-06-21 HISTORY — DX: Localization-related (focal) (partial) idiopathic epilepsy and epileptic syndromes with seizures of localized onset, not intractable, without status epilepticus: G40.009

## 2019-06-21 HISTORY — DX: Family history of other specified conditions: Z84.89

## 2019-06-21 HISTORY — PX: MAXILLARY ANTROSTOMY: SHX2003

## 2019-06-21 HISTORY — PX: ETHMOIDECTOMY: SHX5197

## 2019-06-21 HISTORY — DX: Deviated nasal septum: J34.2

## 2019-06-21 HISTORY — PX: NASAL SEPTOPLASTY W/ TURBINOPLASTY: SHX2070

## 2019-06-21 SURGERY — SEPTOPLASTY, NOSE, WITH NASAL TURBINATE REDUCTION
Anesthesia: General | Site: Nose | Laterality: Bilateral

## 2019-06-21 MED ORDER — OXYMETAZOLINE HCL 0.05 % NA SOLN
NASAL | Status: AC
  Filled 2019-06-21: qty 30

## 2019-06-21 MED ORDER — OXYMETAZOLINE HCL 0.05 % NA SOLN
NASAL | Status: DC | PRN
  Administered 2019-06-21: 1 via TOPICAL

## 2019-06-21 MED ORDER — BACITRACIN ZINC 500 UNIT/GM EX OINT
TOPICAL_OINTMENT | CUTANEOUS | Status: AC
  Filled 2019-06-21: qty 28.35

## 2019-06-21 MED ORDER — MIDAZOLAM HCL 2 MG/2ML IJ SOLN
INTRAMUSCULAR | Status: AC
  Filled 2019-06-21: qty 2

## 2019-06-21 MED ORDER — DIPHENHYDRAMINE HCL 50 MG/ML IJ SOLN: 6.2500 mg | Freq: Once | INTRAMUSCULAR | Status: DC

## 2019-06-21 MED ORDER — ROCURONIUM BROMIDE 100 MG/10ML IV SOLN
INTRAVENOUS | Status: DC | PRN
  Administered 2019-06-21: 60 mg via INTRAVENOUS

## 2019-06-21 MED ORDER — MUPIROCIN 2 % EX OINT
TOPICAL_OINTMENT | CUTANEOUS | Status: DC | PRN
  Administered 2019-06-21: 1 via NASAL

## 2019-06-21 MED ORDER — ESMOLOL HCL 100 MG/10ML IV SOLN
INTRAVENOUS | Status: DC | PRN
  Administered 2019-06-21: 30 mg via INTRAVENOUS

## 2019-06-21 MED ORDER — OXYCODONE-ACETAMINOPHEN 5-325 MG PO TABS: 1.0000 | ORAL_TABLET | Freq: Once | ORAL | Status: DC

## 2019-06-21 MED ORDER — DIPHENHYDRAMINE HCL 50 MG/ML IJ SOLN
INTRAMUSCULAR | Status: AC
  Filled 2019-06-21: qty 1

## 2019-06-21 MED ORDER — HYDROMORPHONE HCL 1 MG/ML IJ SOLN
INTRAMUSCULAR | Status: AC
  Filled 2019-06-21: qty 0.5

## 2019-06-21 MED ORDER — MUPIROCIN 2 % EX OINT
TOPICAL_OINTMENT | CUTANEOUS | Status: AC
  Filled 2019-06-21: qty 22

## 2019-06-21 MED ORDER — MIDAZOLAM HCL 2 MG/2ML IJ SOLN
1.0000 mg | INTRAMUSCULAR | Status: DC | PRN
  Administered 2019-06-21: 08:00:00 2 mg via INTRAVENOUS

## 2019-06-21 MED ORDER — LIDOCAINE HCL (CARDIAC) PF 100 MG/5ML IV SOSY
PREFILLED_SYRINGE | INTRAVENOUS | Status: DC | PRN
  Administered 2019-06-21: 100 mg via INTRAVENOUS

## 2019-06-21 MED ORDER — MEPERIDINE HCL 25 MG/ML IJ SOLN: 6.2500 mg | INTRAMUSCULAR | Status: DC | PRN

## 2019-06-21 MED ORDER — FENTANYL CITRATE (PF) 100 MCG/2ML IJ SOLN
50.0000 ug | INTRAMUSCULAR | Status: AC | PRN
  Administered 2019-06-21 (×2): 50 ug via INTRAVENOUS
  Administered 2019-06-21: 08:00:00 100 ug via INTRAVENOUS
  Administered 2019-06-21: 09:00:00 50 ug via INTRAVENOUS

## 2019-06-21 MED ORDER — AMOXICILLIN 875 MG PO TABS: 875.0000 mg | ORAL_TABLET | Freq: Two times a day (BID) | ORAL | 0 refills | Status: AC

## 2019-06-21 MED ORDER — PROMETHAZINE HCL 25 MG/ML IJ SOLN: 6.2500 mg | INTRAMUSCULAR | Status: DC | PRN

## 2019-06-21 MED ORDER — LACTATED RINGERS IV SOLN
INTRAVENOUS | Status: DC
  Administered 2019-06-21 (×3): via INTRAVENOUS

## 2019-06-21 MED ORDER — ONDANSETRON HCL 4 MG/2ML IJ SOLN
INTRAMUSCULAR | Status: AC
  Filled 2019-06-21: qty 2

## 2019-06-21 MED ORDER — CEFAZOLIN SODIUM-DEXTROSE 2-3 GM-%(50ML) IV SOLR
INTRAVENOUS | Status: DC | PRN
  Administered 2019-06-21: 2 g via INTRAVENOUS

## 2019-06-21 MED ORDER — SUGAMMADEX SODIUM 500 MG/5ML IV SOLN
INTRAVENOUS | Status: DC | PRN
  Administered 2019-06-21: 200 mg via INTRAVENOUS

## 2019-06-21 MED ORDER — CEFAZOLIN SODIUM-DEXTROSE 2-4 GM/100ML-% IV SOLN
INTRAVENOUS | Status: AC
  Filled 2019-06-21: qty 100

## 2019-06-21 MED ORDER — PROPOFOL 10 MG/ML IV BOLUS
INTRAVENOUS | Status: DC | PRN
  Administered 2019-06-21: 30 mg via INTRAVENOUS
  Administered 2019-06-21: 200 mg via INTRAVENOUS

## 2019-06-21 MED ORDER — DEXAMETHASONE SODIUM PHOSPHATE 10 MG/ML IJ SOLN
INTRAMUSCULAR | Status: AC
  Filled 2019-06-21: qty 1

## 2019-06-21 MED ORDER — OXYCODONE-ACETAMINOPHEN 5-325 MG PO TABS: 1.0000 | ORAL_TABLET | ORAL | 0 refills | Status: AC | PRN

## 2019-06-21 MED ORDER — ROCURONIUM BROMIDE 10 MG/ML (PF) SYRINGE
PREFILLED_SYRINGE | INTRAVENOUS | Status: AC
  Filled 2019-06-21: qty 10

## 2019-06-21 MED ORDER — ONDANSETRON HCL 4 MG/2ML IJ SOLN
INTRAMUSCULAR | Status: DC | PRN
  Administered 2019-06-21: 4 mg via INTRAVENOUS

## 2019-06-21 MED ORDER — FENTANYL CITRATE (PF) 100 MCG/2ML IJ SOLN
INTRAMUSCULAR | Status: AC
  Filled 2019-06-21: qty 2

## 2019-06-21 MED ORDER — DEXAMETHASONE SODIUM PHOSPHATE 4 MG/ML IJ SOLN
INTRAMUSCULAR | Status: DC | PRN
  Administered 2019-06-21: 10 mg via INTRAVENOUS

## 2019-06-21 MED ORDER — HYDROMORPHONE HCL 1 MG/ML IJ SOLN
0.2500 mg | INTRAMUSCULAR | Status: DC | PRN
  Administered 2019-06-21: 0.5 mg via INTRAVENOUS

## 2019-06-21 MED ORDER — PROPOFOL 500 MG/50ML IV EMUL
INTRAVENOUS | Status: AC
  Filled 2019-06-21: qty 50

## 2019-06-21 MED ORDER — LIDOCAINE-EPINEPHRINE 1 %-1:100000 IJ SOLN
INTRAMUSCULAR | Status: DC | PRN
  Administered 2019-06-21: 3 mL

## 2019-06-21 MED ORDER — LIDOCAINE-EPINEPHRINE 1 %-1:100000 IJ SOLN
INTRAMUSCULAR | Status: AC
  Filled 2019-06-21: qty 1

## 2019-06-21 SURGICAL SUPPLY — 65 items
BLADE RAD40 ROTATE 4M 4 5PK (BLADE) IMPLANT
BLADE RAD40 ROTATE 4M 4MM 5PK (BLADE)
BLADE RAD60 ROTATE M4 4 5PK (BLADE) IMPLANT
BLADE RAD60 ROTATE M4 4MM 5PK (BLADE)
BLADE ROTATE RAD 12 4 M4 (BLADE) IMPLANT
BLADE ROTATE RAD 12 4MM M4 (BLADE)
BLADE ROTATE RAD 40 4 M4 (BLADE) IMPLANT
BLADE ROTATE RAD 40 4MM M4 (BLADE)
BLADE ROTATE TRICUT 4MX13CM M4 (BLADE) ×1
BLADE ROTATE TRICUT 4X13 M4 (BLADE) ×2 IMPLANT
BLADE TRICUT ROTATE M4 4 5PK (BLADE) IMPLANT
BLADE TRICUT ROTATE M4 4MM 5PK (BLADE)
BUR HS RAD FRONTAL 3 (BURR) IMPLANT
BUR HS RAD FRONTAL 3MM (BURR)
CANISTER SUC SOCK COL 7IN (MISCELLANEOUS) ×3 IMPLANT
CANISTER SUCT 1200ML W/VALVE (MISCELLANEOUS) ×3 IMPLANT
COAGULATOR SUCT 6 FR SWTCH (ELECTROSURGICAL)
COAGULATOR SUCT 8FR VV (MISCELLANEOUS) ×3 IMPLANT
COAGULATOR SUCT SWTCH 10FR 6 (ELECTROSURGICAL) IMPLANT
COVER WAND RF STERILE (DRAPES) IMPLANT
DECANTER SPIKE VIAL GLASS SM (MISCELLANEOUS) IMPLANT
DRSG NASAL KENNEDY LMNT 8CM (GAUZE/BANDAGES/DRESSINGS) IMPLANT
DRSG NASOPORE 8CM (GAUZE/BANDAGES/DRESSINGS) IMPLANT
DRSG TELFA 3X8 NADH (GAUZE/BANDAGES/DRESSINGS) IMPLANT
ELECT REM PT RETURN 9FT ADLT (ELECTROSURGICAL) ×3
ELECTRODE REM PT RTRN 9FT ADLT (ELECTROSURGICAL) ×1 IMPLANT
GLOVE BIO SURGEON STRL SZ7.5 (GLOVE) ×3 IMPLANT
GLOVE BIOGEL PI IND STRL 7.0 (GLOVE) ×1 IMPLANT
GLOVE BIOGEL PI INDICATOR 7.0 (GLOVE) ×2
GLOVE ECLIPSE 6.5 STRL STRAW (GLOVE) ×3 IMPLANT
GOWN STRL REUS W/ TWL LRG LVL3 (GOWN DISPOSABLE) ×2 IMPLANT
GOWN STRL REUS W/TWL LRG LVL3 (GOWN DISPOSABLE) ×4
HEMOSTAT SURGICEL 2X14 (HEMOSTASIS) IMPLANT
IV NS 1000ML (IV SOLUTION)
IV NS 1000ML BAXH (IV SOLUTION) IMPLANT
IV NS 500ML (IV SOLUTION) ×3
IV NS 500ML BAXH (IV SOLUTION) ×1 IMPLANT
NEEDLE HYPO 25X1 1.5 SAFETY (NEEDLE) ×3 IMPLANT
NEEDLE PRECISIONGLIDE 27X1.5 (NEEDLE) IMPLANT
NEEDLE SPNL 25GX3.5 QUINCKE BL (NEEDLE) IMPLANT
NS IRRIG 1000ML POUR BTL (IV SOLUTION) ×3 IMPLANT
PACK BASIN DAY SURGERY FS (CUSTOM PROCEDURE TRAY) ×3 IMPLANT
PACK ENT DAY SURGERY (CUSTOM PROCEDURE TRAY) ×3 IMPLANT
SLEEVE SCD COMPRESS KNEE MED (MISCELLANEOUS) ×3 IMPLANT
SOLUTION BUTLER CLEAR DIP (MISCELLANEOUS) ×3 IMPLANT
SPLINT NASAL AIRWAY SILICONE (MISCELLANEOUS) ×3 IMPLANT
SPONGE GAUZE 2X2 8PLY STER LF (GAUZE/BANDAGES/DRESSINGS) ×1
SPONGE GAUZE 2X2 8PLY STRL LF (GAUZE/BANDAGES/DRESSINGS) ×2 IMPLANT
SPONGE NEURO XRAY DETECT 1X3 (DISPOSABLE) ×3 IMPLANT
SUCTION FRAZIER HANDLE 10FR (MISCELLANEOUS)
SUCTION TUBE FRAZIER 10FR DISP (MISCELLANEOUS) IMPLANT
SUT CHROMIC 4 0 P 3 18 (SUTURE) ×3 IMPLANT
SUT PLAIN 4 0 ~~LOC~~ 1 (SUTURE) ×3 IMPLANT
SUT PROLENE 3 0 PS 2 (SUTURE) ×3 IMPLANT
SUT VIC AB 4-0 P-3 18XBRD (SUTURE) IMPLANT
SUT VIC AB 4-0 P3 18 (SUTURE)
SYR 50ML LL SCALE MARK (SYRINGE) ×3 IMPLANT
TOWEL GREEN STERILE FF (TOWEL DISPOSABLE) ×3 IMPLANT
TRACKER ENT INSTRUMENT (MISCELLANEOUS) ×3 IMPLANT
TRACKER ENT PATIENT (MISCELLANEOUS) ×3 IMPLANT
TUBE CONNECTING 20'X1/4 (TUBING) ×1
TUBE CONNECTING 20X1/4 (TUBING) ×2 IMPLANT
TUBE SALEM SUMP 16 FR W/ARV (TUBING) ×3 IMPLANT
TUBING STRAIGHTSHOT EPS 5PK (TUBING) ×3 IMPLANT
YANKAUER SUCT BULB TIP NO VENT (SUCTIONS) ×3 IMPLANT

## 2019-06-21 NOTE — Transfer of Care (Signed)
Immediate Anesthesia Transfer of Care Note  Patient: Susan Jefferson  Procedure(s) Performed: NASAL SEPTOPLASTY WITH TURBINATE REDUCTION (Bilateral Nose) BILATERAL MAXILLARY ANTROSTOMY WITH TISSUE REMOVAL (Bilateral Nose) BILATERAL TOTAL ETHMOIDECTOMY AND SPHENOIDECTOMY (Bilateral Nose) SINUS ENDO WITH FUSION (Bilateral Nose)  Patient Location: PACU  Anesthesia Type:General  Level of Consciousness: sedated  Airway & Oxygen Therapy: Patient Spontanous Breathing and Patient connected to face mask oxygen  Post-op Assessment: Report given to RN and Post -op Vital signs reviewed and stable  Post vital signs: Reviewed and stable  Last Vitals:  Vitals Value Taken Time  BP 128/81 06/21/19 1001  Temp    Pulse 105 06/21/19 1004  Resp 15 06/21/19 1004  SpO2 94 % 06/21/19 1004  Vitals shown include unvalidated device data.  Last Pain:  Vitals:   06/21/19 0646  TempSrc: Oral  PainSc: 2          Complications: No apparent anesthesia complications

## 2019-06-21 NOTE — H&P (Signed)
Cc: Chronic rhinosinusitis, nasal obstruction  HPI: The patient is a 48 year old female who returns today complaining of persistent infection and mucoid drainage.  The patient was last seen 1 week ago.  At that time, she was noted to have chronic maxillary, ethmoid, and sphenoid sinusitis.  The patient was previously treated with multiple courses of antibiotics and allergy medications.  Despite her treatment, she continues to be symptomatic. Currently she reports chronic mucopurulent drainage.  She also has numerous questions regarding the possible surgical intervention. Anesthesia: topical Xylocaine and Neo-Synephrine.  Exam: The nasal cavities were decongested and anesthetised with a combination of oxymetazoline and 4% lidocaine solution.  The flexible scope was inserted into the right nasal cavity.  Endoscopy of the inferior and middle meatus was performed.  Edematous mucosa was noted.  Mucopurulent drainage noted.  Olfactory cleft was edematous.  NSD noted. Nasopharynx was clear.  Turbinates were hypertrophied but without mass.  Incomplete response to decongestion.  The procedure was repeated on the contralateral side with similar findings.  The patient tolerated the procedure well.  Instructions were given to avoid eating or drinking for 2 hours.    Assessment: 1.  Bilateral chronic maxillary, ethmoid and sphenoid sinusitis.   2.  Chronic nasal congestion, secondary to nasal septal deviation and bilateral inferior turbinate hypertrophy.   Plan: 1.  The physical exam findings and her previous CT images are extensively reviewed.   2.  The patient is scheduled to undergo bilateral endoscopic sinus surgery next week.  The details and risks of the surgery are extensively reviewed.   3.  Questions are invited and answered4.  Since the patient was recently treated with multiple courses of extended antibiotics, no new prescription is given.  5.  The patient would like to proceed with the procedures.

## 2019-06-21 NOTE — Op Note (Signed)
DATE OF PROCEDURE: 06/21/2019  OPERATIVE REPORT   SURGEON: Newman PiesSu Jimena Wieczorek, MD   PREOPERATIVE DIAGNOSES:  1. Severe nasal septal deviation.  2. Bilateral inferior turbinate hypertrophy.  3. Chronic nasal obstruction. 4. Bilateral chronic maxillary, ethmoid, and sphenoid sinusitis and polyposis.  POSTOPERATIVE DIAGNOSES:  1. Severe nasal septal deviation.  2. Bilateral inferior turbinate hypertrophy.  3. Chronic nasal obstruction. 4. Bilateral chronic maxillary, ethmoid, and sphenoid sinusitis and polyposis.  PROCEDURE PERFORMED:  1. Bilateral endoscopic total ethmoidectomy and sphenoidotomy with polyp removal. 2. Bilateral endoscopic maxillary antrostomy with polyp removal. 3. Septoplasty.  4. Bilateral partial inferior turbinate resection.  5. FUSION stereotactic image guidance.  ANESTHESIA: General endotracheal tube anesthesia.   COMPLICATIONS: None.   ESTIMATED BLOOD LOSS: 500 mL.   INDICATION FOR PROCEDURE: Susan Jefferson is a 48 y.o. female with a history of chronic rhinosinusitis and chronic nasal obstruction. The patient was  treated with multiple courses of extended antibiotics, antihistamine, decongestant, steroid nasal spray, and systemic steroids. However, the patient continued to be symptomatic. On examination, the patient was noted to have bilateral severe inferior turbinate hypertrophy and significant nasal septal deviation, causing significant nasal obstruction.  Mucopurulent drainage was also noted.  Her CT scan showed bilateral partial opacifications of her maxillary, ethmoid, and sphenoid sinuses.  Based on the above findings, the decision was made for the patient to undergo the above-stated procedures. The risks, benefits, alternatives, and details of the procedures were discussed with the patient. Questions were invited and answered. Informed consent was obtained.   DESCRIPTION OF PROCEDURE: The patient was taken to the operating room and placed supine on the operating  table. General endotracheal tube anesthesia was administered by the anesthesiologist. The patient was positioned, and prepped and draped in the standard fashion for nasal surgery. Pledgets soaked with Afrin were placed in both nasal cavities for decongestion. The pledgets were subsequently removed. The FUSION stereotactic image guidance marker was placed. The image guidance system was functional throughout the case.  Examination of the nasal cavity revealed a severe nasal septal deviationd. 1% lidocaine with 1:100,000 epinephrine was injected onto the nasal septum bilaterally. A hemitransfixion incision was made on the left side. The mucosal flap was carefully elevated on the left side.  A portion of the cartilage was missing, presumably secondary to previous surgery/trauma.  A cartilaginous incision was made 1 cm superior to the caudal margin of the nasal septum. Mucosal flap was also elevated on the right side in the similar fashion. The deviated portion of the cartilaginous and bony septum had to be removed in piecemeal fashion. Once the deviated portions were removed, a straight midline septum was achieved. The septum was then quilted with 4-0 plain gut sutures. The hemitransfixion incision was closed with interrupted 4-0 chromic sutures.   The inferior one half of both hypertrophied inferior turbinate was crossclamped with a Kelly clamp. The inferior one half of each inferior turbinate was then resected with a pair of cross cutting scissors. Hemostasis was achieved with a suction cautery device.   Using a 0 endoscope, the left nasal cavity was examined.  The left middle turbinate was carefully medialized.  Polypoid tissue was noted within the middle meatus. The polypoid tissue was removed using a combination of microdebrider and Blakesley forceps. The uncinate process was resected with a freer elevator. The maxillary antrum was entered and enlarged using a combination of backbiter and microdebrider.  Polypoid tissue was removed from the left maxillary sinus.  Attention was then focused on the ethmoid sinuses.  The bony partitions of the anterior and posterior ethmoid cavities were taken down. Polypoid tissue was noted and removed.  More polyps were noted to obstruct the left sphenoid opening. The polyps were removed. The sphenoid opening was entered and enlarged.  Polypoid tissue was removed. All paranasal sinuses were copiously irrigated with saline solution.  The same procedure was repeated on the right side without exception. More polyps were noted on the right side. All polyps were removed. Doyle splints were applied to the nasal septum.  The care of the patient was turned over to the anesthesiologist. The patient was awakened from anesthesia without difficulty. The patient was extubated and transferred to the recovery room in good condition.   OPERATIVE FINDINGS: Nnasal septal deviation and bilateral inferior turbinate hypertrophy.  Bilateral chronic rhinosinusitis and polyposis.  SPECIMEN: Nasal and sinus contents.  FOLLOWUP CARE: The patient be discharged home once she is awake and alert. The patient will be placed on Percocet 1 tablets p.o. q.4 hours p.r.n. pain, and amoxicillin 875 mg p.o. b.i.d. for 5 days. The patient will follow up in my office in approximately 1 week for splint removal.   Majesty Oehlert Raynelle Bring, MD

## 2019-06-21 NOTE — Discharge Instructions (Addendum)

## 2019-06-21 NOTE — Anesthesia Postprocedure Evaluation (Signed)
Anesthesia Post Note  Patient: Susan Jefferson  Procedure(s) Performed: NASAL SEPTOPLASTY WITH TURBINATE REDUCTION (Bilateral Nose) BILATERAL MAXILLARY ANTROSTOMY WITH TISSUE REMOVAL (Bilateral Nose) BILATERAL TOTAL ETHMOIDECTOMY AND SPHENOIDECTOMY (Bilateral Nose) SINUS ENDO WITH FUSION (Bilateral Nose)     Patient location during evaluation: PACU Anesthesia Type: General Level of consciousness: sedated and patient cooperative Pain management: pain level controlled Vital Signs Assessment: post-procedure vital signs reviewed and stable Respiratory status: spontaneous breathing Cardiovascular status: stable Anesthetic complications: no    Last Vitals:  Vitals:   06/21/19 1145 06/21/19 1221  BP: 124/73 136/85  Pulse: 98 (!) 102  Resp: 18 20  Temp:  36.8 C  SpO2: 94% 98%    Last Pain:  Vitals:   06/21/19 1221  TempSrc: Oral  PainSc: [name]

## 2019-06-21 NOTE — Anesthesia Procedure Notes (Signed)
Procedure Name: Intubation Date/Time: 06/21/2019 7:43 AM Performed by: Maryella Shivers, CRNA Pre-anesthesia Checklist: Patient identified, Emergency Drugs available, Suction available and Patient being monitored Patient Re-evaluated:Patient Re-evaluated prior to induction Oxygen Delivery Method: Circle system utilized Preoxygenation: Pre-oxygenation with 100% oxygen Induction Type: IV induction Ventilation: Mask ventilation without difficulty Laryngoscope Size: Mac and 3 Grade View: Grade II Tube type: Oral Rae Tube size: 7.0 mm Number of attempts: 2 Airway Equipment and Method: Stylet and Oral airway Placement Confirmation: ETT inserted through vocal cords under direct vision,  positive ETCO2 and breath sounds checked- equal and bilateral Secured at: 20 cm Tube secured with: Tape Dental Injury: Teeth and Oropharynx as per pre-operative assessment

## 2019-06-22 ENCOUNTER — Encounter (HOSPITAL_BASED_OUTPATIENT_CLINIC_OR_DEPARTMENT_OTHER): Payer: Self-pay | Admitting: Otolaryngology

## 2019-06-25 ENCOUNTER — Ambulatory Visit (INDEPENDENT_AMBULATORY_CARE_PROVIDER_SITE_OTHER): Payer: BC Managed Care – PPO | Admitting: Otolaryngology

## 2019-06-25 ENCOUNTER — Other Ambulatory Visit: Payer: Self-pay

## 2019-06-25 DIAGNOSIS — J323 Chronic sphenoidal sinusitis: Secondary | ICD-10-CM | POA: Diagnosis not present

## 2019-06-25 DIAGNOSIS — J322 Chronic ethmoidal sinusitis: Secondary | ICD-10-CM | POA: Diagnosis not present

## 2019-06-25 DIAGNOSIS — J338 Other polyp of sinus: Secondary | ICD-10-CM | POA: Diagnosis not present

## 2019-06-25 DIAGNOSIS — J320 Chronic maxillary sinusitis: Secondary | ICD-10-CM | POA: Diagnosis not present

## 2019-06-27 ENCOUNTER — Ambulatory Visit: Payer: BC Managed Care – PPO | Admitting: Allergy

## 2019-06-27 NOTE — Progress Notes (Deleted)
Follow Up Note  RE: Susan BridgeChrysta Jefferson MRN: [id number] DOB: [birth year] Date of Office Visit: 06/27/2019  Referring provider: Renford DillsPolite, Ronald, MD Primary care provider: Renford DillsPolite, Ronald, MD  Chief Complaint: No chief complaint on file.  History of Present Illness: I had the pleasure of seeing Susan Jefferson for a follow up visit at the [facility] on 06/27/2019. She is a 48 y.o. female, who is being followed for chronic rhinitis with recurrent sinusitis. Today she is here for regular follow up visit.  Her previous allergy office visit was on 03/01/2019 with Dr. Selena BattenKim.   Acute maxillary sinusitis Past history - Rhinitis symptoms for the past 20 years from fall through spring. Tried zyrtec, Claritin and Flonase with some benefit. No previous ENT evaluation. 2019 testing was negative to environmental allergies including the positive control questioning the validity of the results. 2019 immunocap was negative to environmental allergies as well.  Interim history - symptoms improved while on prednisone and Augmentin did help but once she completed medications all her symptoms returned. Unable to see ENT until June due to Coronavirus pandemic.  She most likely has another sinus infection.  Start doxycycline 100mg  twice a day for 10 days.  Start prednisone taper.   Start xhance 2 sprays twice daily.  Nasal saline spray (i.e., Simply Saline) or nasal saline lavage (i.e., NeilMed) is recommended as needed and prior to medicated nasal sprays.  May continue Claritin daily if needed.  May use mucinex twice a day with plenty of water to help with loosening up the discharge.   Follow up with ENT as scheduled.  Return in about 3 months (around 06/01/2019).  Assessment and Plan: Susan Jefferson is a 48 y.o. female with: No problem-specific Assessment & Plan notes found for this encounter.  No follow-ups on file.  No orders of the defined types were placed in this encounter.  Lab Orders   No laboratory test(s) ordered today    Diagnostics: Spirometry:  Tracings reviewed. Her effort: {Blank single:19197::"Good reproducible efforts.","It was hard to get consistent efforts and there is a question as to whether this reflects a maximal maneuver.","Poor effort, data can not be interpreted."} FVC: ***L FEV1: ***L, ***% predicted FEV1/FVC ratio: ***% Interpretation: {Blank single:19197::"Spirometry consistent with mild obstructive disease","Spirometry consistent with moderate obstructive disease","Spirometry consistent with severe obstructive disease","Spirometry consistent with possible restrictive disease","Spirometry consistent with mixed obstructive and restrictive disease","Spirometry uninterpretable due to technique","Spirometry consistent with normal pattern","No overt abnormalities noted given today's efforts"}.  Please see scanned spirometry results for details.  Skin Testing: {Blank single:19197::"Select foods","Environmental allergy panel","Environmental allergy panel and select foods","Food allergy panel","None","Deferred due to recent antihistamines use"}. Positive test to: ***. Negative test to: ***.  Results discussed with patient/family.   Medication List:  Current Outpatient Medications  Medication Sig Dispense Refill  . escitalopram (LEXAPRO) 10 MG tablet TK 1 T PO QD  3  . mirtazapine (REMERON) 15 MG tablet      No current facility-administered medications for this visit.    Allergies: Allergies  Allergen Reactions  . Succinylcholine     Pt states that after one surgery it was difficult to wake her mother up. Her mother was told to never take succinylcholine again and to tell her children this.     I reviewed her past medical history, social history, family history, and environmental history and no significant changes have been reported from previous visit on 03/01/2019.  Review of Systems  Constitutional: Negative for appetite change, chills, fever and  unexpected  weight change.  HENT: Positive for congestion, sinus pressure and sinus pain. Negative for rhinorrhea.   Eyes: Negative for itching.  Respiratory: Negative for wheezing.   Cardiovascular: Negative for chest pain.  Gastrointestinal: Negative for abdominal pain.  Genitourinary: Negative for difficulty urinating.  Skin: Negative for rash.  Allergic/Immunologic: Negative for environmental allergies and food allergies.  Neurological: Positive for headaches.   Objective: There were no vitals taken for this visit. There is no height or weight on file to calculate BMI. Physical Exam  Constitutional: She is oriented to person, place, and time. She appears well-developed and well-nourished.  HENT:  Head: Normocephalic and atraumatic.  Right Ear: External ear normal.  Left Ear: External ear normal.  Nose: Nose normal.  Mouth/Throat: Oropharynx is clear and moist.  Eyes: Conjunctivae and EOM are normal.  Neck: Neck supple.  Cardiovascular: Normal rate, regular rhythm and normal heart sounds. Exam reveals no gallop and no friction rub.  No murmur heard. Pulmonary/Chest: Effort normal and breath sounds normal. She has no wheezes. She has no rales.  Neurological: She is alert and oriented to person, place, and time.  Skin: Skin is warm. No rash noted.  Psychiatric: She has a normal mood and affect. Her behavior is normal.  Nursing note and vitals reviewed.  Previous notes and tests were reviewed. The plan was reviewed with the patient/family, and all questions/concerned were addressed.  It was my pleasure to see Susan Jefferson today and participate in her care. Please feel free to contact me with any questions or concerns.  Sincerely,  Rexene Alberts, DO Allergy & Immunology  [facility] office: [phone] [facility] office: [place] office: [phone]

## 2019-10-03 ENCOUNTER — Other Ambulatory Visit: Payer: Self-pay

## 2019-10-03 DIAGNOSIS — Z20822 Contact with and (suspected) exposure to covid-19: Secondary | ICD-10-CM

## 2019-10-04 LAB — NOVEL CORONAVIRUS, NAA: SARS-CoV-2, NAA: NOT DETECTED

## 2020-02-19 IMAGING — CT CT MAXILLOFACIAL WITHOUT CONTRAST
1 series · 15 of 30 positions shown, 19 images · non-contrast
Comparison: None.

CLINICAL DATA: Chronic sinusitis.  Prior sinus surgery

EXAM:
CT MAXILLOFACIAL WITHOUT CONTRAST
TECHNIQUE: Multidetector CT images of the paranasal sinuses were obtained using
the standard protocol without intravenous contrast.

[Series 4: soft tissue · axial · 0.37mm/px · z∈[+93,+223]mm · 15 of 140 slices shown, 19 images]
[im 5/140  brain]
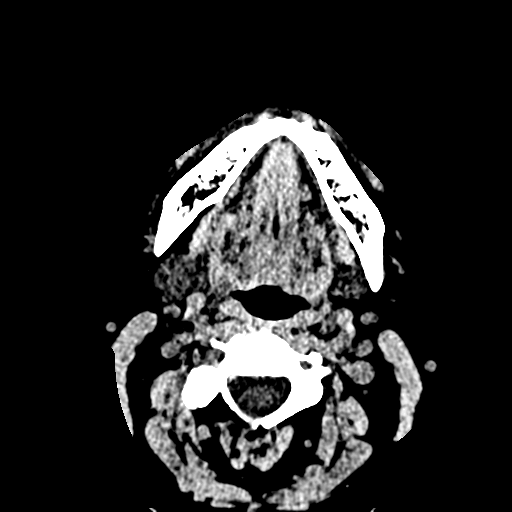
[im 5/140  bone]
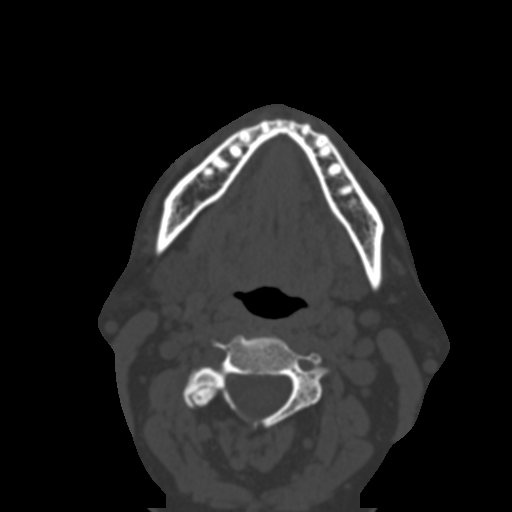
[im 15/140  bone]
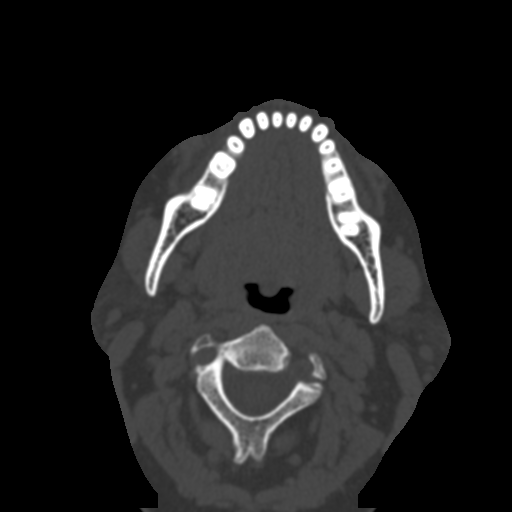
[im 24/140  bone]
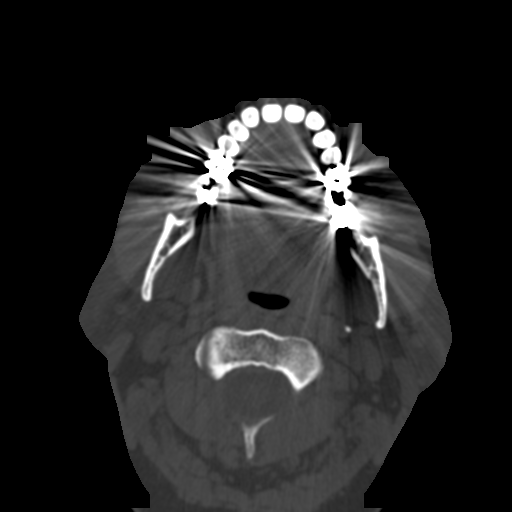
[im 34/140  bone]
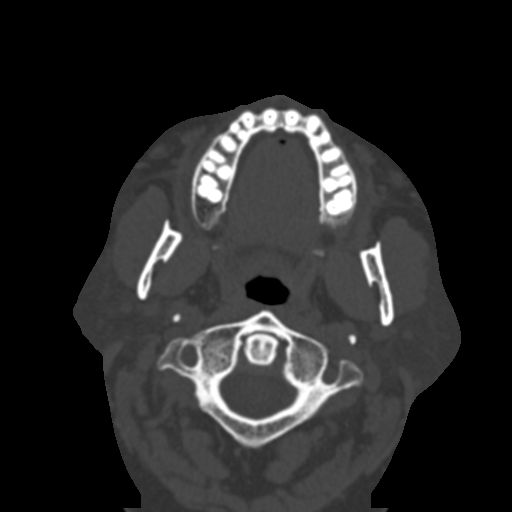
[im 44/140  brain]
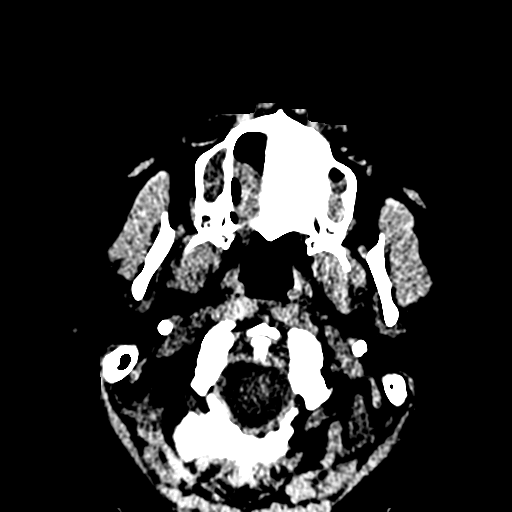
[im 44/140  bone]
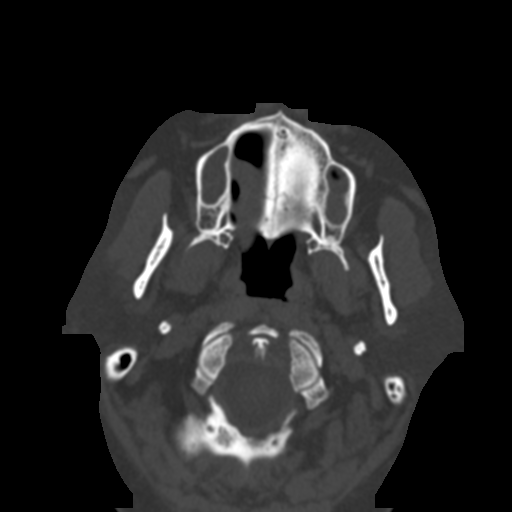
[im 53/140  bone]
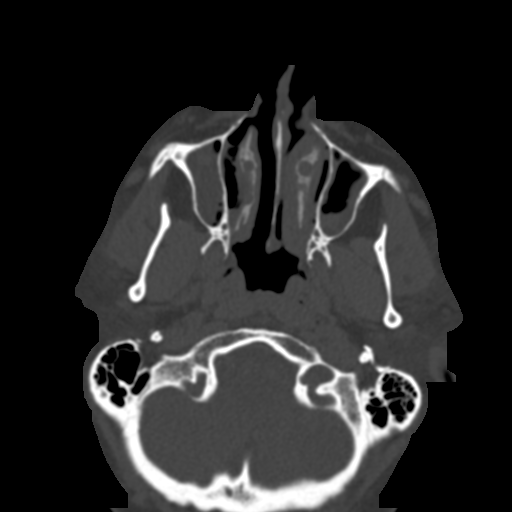
[im 63/140  bone]
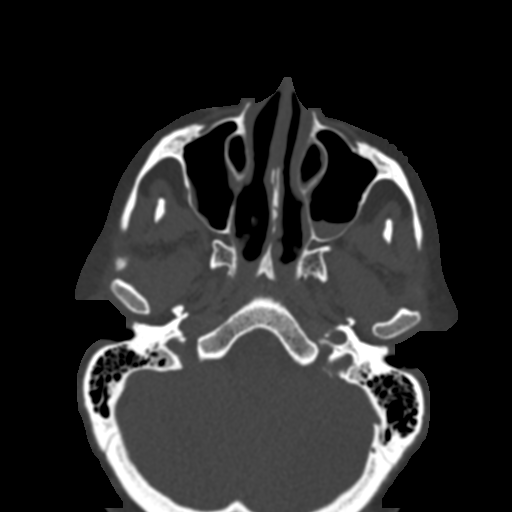
[im 72/140  bone]
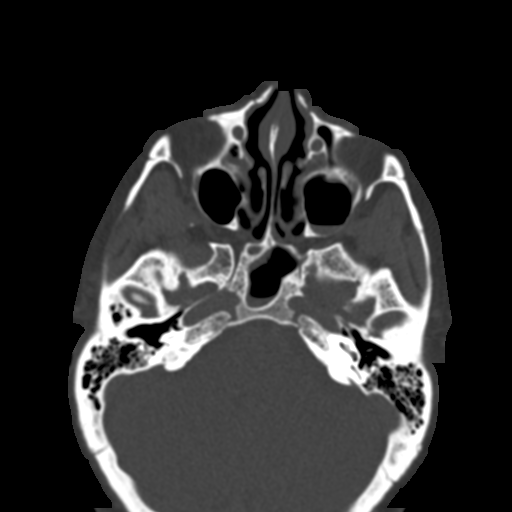
[im 77/140  brain]
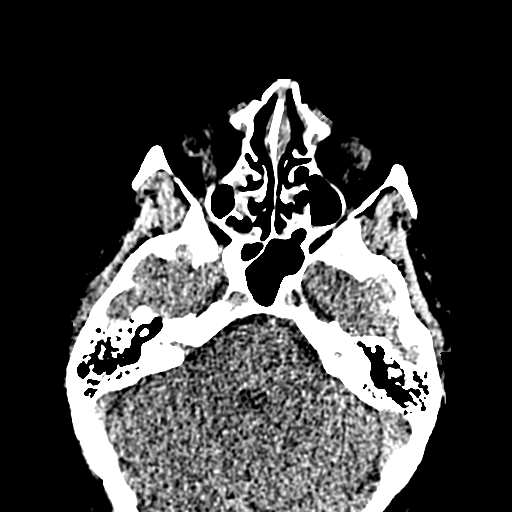
[im 77/140  bone]
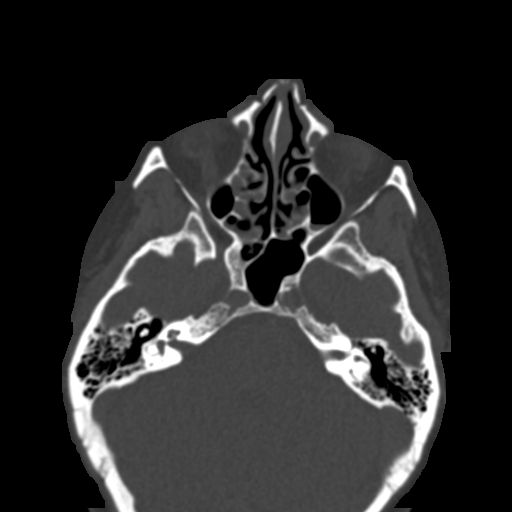
[im 87/140  bone]
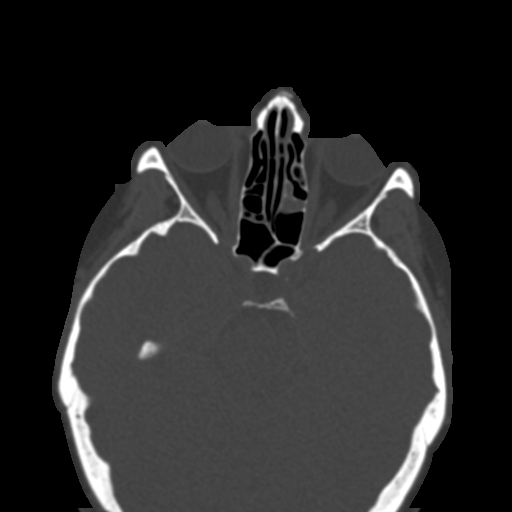
[im 96/140  bone]
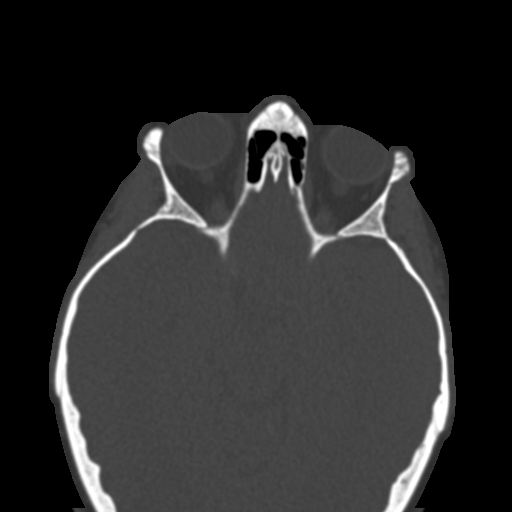
[im 106/140  bone]
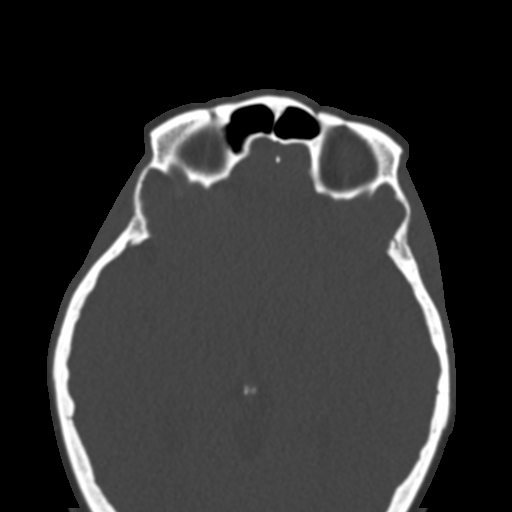
[im 116/140  brain]
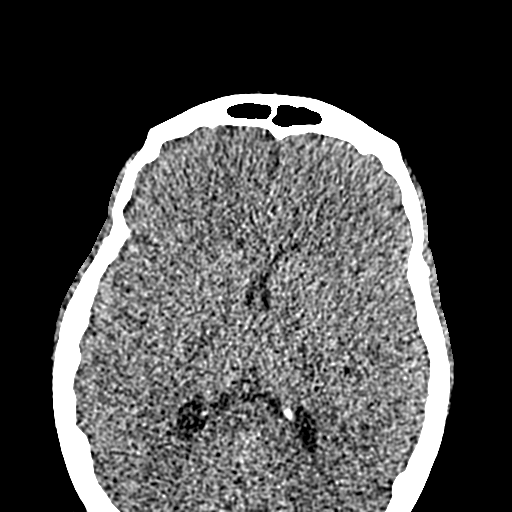
[im 116/140  bone]
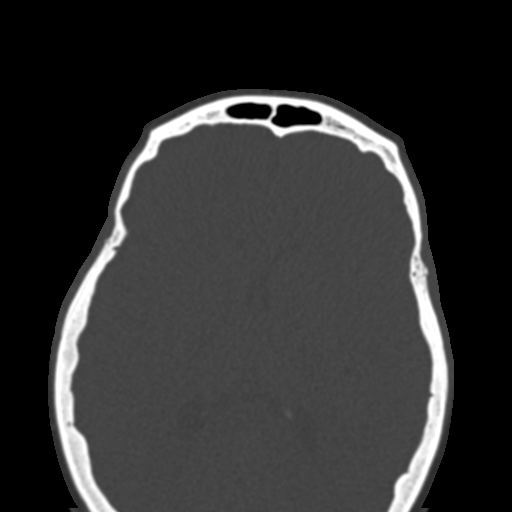
[im 125/140  bone]
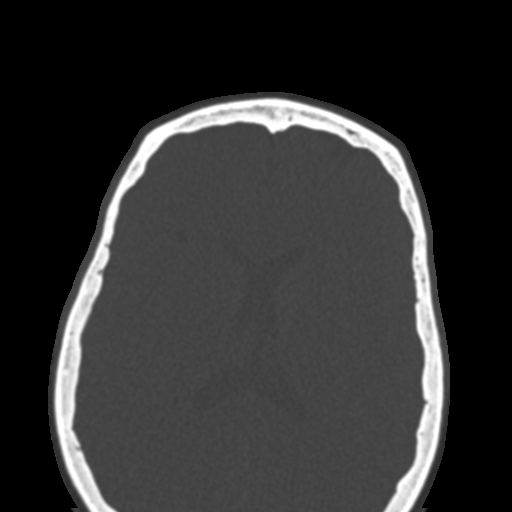
[im 135/140  bone]
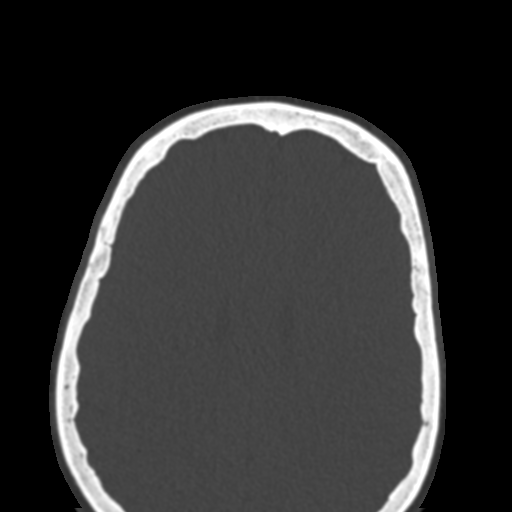

[15 of 30 positions shown; findings below may reference images not displayed]

FINDINGS: Paranasal sinuses:

Frontal: Normally aerated. Patent frontal sinus drainage pathways.

Ethmoid: Moderate mucosal edema bilaterally

Maxillary: Moderate mucosal edema right maxillary sinus. Mucosal
edema left maxillary sinus with air-fluid level

Sphenoid: Mucosal edema with air-fluid level

Mastoid sinus: Clear bilaterally

Right ostiomeatal unit: Narrowing and possible occlusion due to
mucosal edema

Left ostiomeatal unit: Narrowing with probable occlusion due to
mucosal edema.

Nasal passages: Patent. Intact nasal septum is midline.

Anatomy: Nasal bone fracture, presumably chronic

Limited intracranial imaging negative

Negative orbit.
IMPRESSION: Diffuse mucosal edema throughout the nasal paranasal sinuses.
Air-fluid level left maxillary and sphenoid sinus. Probable
occlusion of the ostiomeatal complex bilaterally due to mucosal
edema.

Nasal bone fracture appears chronic.

## 2020-03-25 ENCOUNTER — Other Ambulatory Visit: Payer: Self-pay

## 2020-03-25 ENCOUNTER — Ambulatory Visit (INDEPENDENT_AMBULATORY_CARE_PROVIDER_SITE_OTHER): Payer: BC Managed Care – PPO | Admitting: Allergy & Immunology

## 2020-03-25 ENCOUNTER — Encounter: Payer: Self-pay | Admitting: Allergy & Immunology

## 2020-03-25 VITALS — BP 118/72 | HR 82 | Temp 98.1°F | Resp 16 | Ht 64.0 in | Wt 221.6 lb

## 2020-03-25 DIAGNOSIS — R490 Dysphonia: Secondary | ICD-10-CM | POA: Diagnosis not present

## 2020-03-25 DIAGNOSIS — R05 Cough: Secondary | ICD-10-CM

## 2020-03-25 DIAGNOSIS — R059 Cough, unspecified: Secondary | ICD-10-CM

## 2020-03-25 DIAGNOSIS — J310 Chronic rhinitis: Secondary | ICD-10-CM

## 2020-03-25 NOTE — Progress Notes (Signed)
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 Coughing - Lung testing looked fairly normal today. - However, we are going to see whether this controller medication can help your cough. - Use one puff twice daily of the AirDuo. - Wash your mouth out after each use.  - I would consider the diagnosis of vocal cord dysfunction is the controller medication does not help. - I do not think that the reflux is involved since these medications never helped at all.   2. Non-allergic rhinitis - We are going to get skin testing on Thursday 03/27/20 at 3:00pm. - I think we are going to know more after this testing is performed.  - We can try changing some things around at that point to get to the bottom of this mucous production.   3. Return in about 2 days (around 03/27/2020).    Subjective:   Luka Reisch is a 49 y.o. female presenting today for follow up of  Chief Complaint  Patient presents with  . Rash  . Sinusitis    Yohana Grime has a history of the following: Patient Active Problem List   Diagnosis Date Noted  . Acute maxillary sinusitis 01/01/2019  . Eustachian tube dysfunction, bilateral 11/02/2018  . Coughing 11/01/2018  . Non-allergic rhinitis 11/01/2018    History obtained from: chart review and patient.  Farren is a 49 y.o. female presenting for a follow up visit. She was last seen in March 2020 by Dr. Selena Batten, when she was treated for a sinus infection. It seems to be Naseem's trend that she comes in for sinus infections around this time of the year.   Since the last visit, she has mostly done well.  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  She Is today at the beginning of her story. She tells me that around 2-1/2  years ago, she went to urgent care due to a persistent cough.  She received steroids, antibiotics, albuterol, reflux medication, and Claritin.  She did not want to take all at once because she wanted to systematically figure out what was going on.  The steroid, antibiotic, reflux medication, and albuterol did not do anything to help her cough.  However, the Claritin did improve her cough.  She stopped the Claritin and the cough came back.  That she had of the Claritin back on and this controlled the cough.  However, over time, the Claritin stopped helping with the cough.  She also had incidentally a rash on her right hip that cleared up immediately when she started the Claritin.  Over time, the cough continued and seemed to worsen.  She had one episode at work when she lost her voice.  One of her coworkers gave her some albuterol which actually helped her voice to return immediately.  She felt like a new person after 2 puffs of albuterol.   She never lost her voice again, but the cough is continued.  She continues to think that this is related to allergies, although both blood work and prick testing was negative to all of the allergens.  She has never had intradermal testing done.  She has been given cough medicine by her PCP, but this did nothing to help with the cough.  It does  not seem that she has ever seen gastroenterology for pulmonology for the cough.  She has never been evaluated for vocal cord dysfunction.  Nonallergic Rhinitis Symptom History: Regarding her rhinitis, she is not using anything at all for her symptoms. She came here originally a couple of years ago and had allergy testing performed. She did this due to a history of cough. She blows her nose "tens times per day". She has used a multitude of nose sprays with minimal relief. She had surgery on her sinuses within the last couple of months. This did help her cough significantly. She reports clearing her throat "all day long".  Her surgeon,  Dr. Benjamine Mola, recommended that she touch base with [place] again to see whether the cough was allergy mediated.  Eczema Symptom History: She reports that she has a rash that she has had for around ten years. The rash and the coughing got worse when she moved back here around ten years ago from the [place] part of the state.   Otherwise, there have been no changes to her past medical history, surgical history, family history, or social history.    Review of Systems  Constitutional: Negative.  Negative for fever, malaise/fatigue and weight loss.  HENT: Positive for sinus pain. Negative for congestion, ear discharge, ear pain and nosebleeds.   Eyes: Negative for pain, discharge and redness.  Respiratory: Positive for cough and stridor. Negative for sputum production, shortness of breath and wheezing.   Cardiovascular: Negative.  Negative for chest pain and palpitations.  Gastrointestinal: Negative for abdominal pain, constipation, diarrhea, heartburn, nausea and vomiting.  Skin: Negative.  Negative for itching and rash.  Neurological: Negative for dizziness and headaches.  Endo/Heme/Allergies: Negative for environmental allergies. Does not bruise/bleed easily.       Objective:   Blood pressure 118/72, pulse 82, temperature 98.1 F (36.7 C), temperature source Temporal, resp. rate 16, height 5\' 4"  (1.626 m), weight 221 lb 9.6 oz (100.5 kg), SpO2 97 %. Body mass index is 38.04 kg/m.   Physical Exam:  Physical Exam  Constitutional: She appears well-developed.  Talkative female.  HENT:  Head: Normocephalic and atraumatic.  Right Ear: Tympanic membrane, external ear and ear canal normal.  Left Ear: Tympanic membrane, external ear and ear canal normal.  Nose: Mucosal edema and rhinorrhea present. No nasal deformity or septal deviation. No epistaxis. Right sinus exhibits no maxillary sinus tenderness and no frontal sinus tenderness. Left sinus exhibits no maxillary sinus tenderness and no frontal  sinus tenderness.  Mouth/Throat: Uvula is midline and oropharynx is clear and moist. Mucous membranes are not pale and not dry.  Turbinates enlarged.  There are erythematous.  Hoarse voice (baseline per patient).    Eyes: Pupils are equal, round, and reactive to light. Conjunctivae and EOM are normal. Right eye exhibits no chemosis and no discharge. Left eye exhibits no chemosis and no discharge. Right conjunctiva is not injected. Left conjunctiva is not injected.  Cardiovascular: Normal rate, regular rhythm and normal heart sounds.  Respiratory: Effort normal and breath sounds normal. No accessory muscle usage. No tachypnea. No respiratory distress. She has no wheezes. She has no rhonchi. She has no rales. She exhibits no tenderness.  Moving air well in all lung fields.  Lymphadenopathy:    She has no cervical adenopathy.  Neurological: She is alert.  Skin: No abrasion, no petechiae and no rash noted. Rash is not papular, not vesicular and not urticarial. No erythema. No pallor.  Psychiatric: She has a normal mood  and affect.     Diagnostic studies:    Spirometry: results normal (FEV1: 2.65/92%, FVC: 3.22/89%, FEV1/FVC: 82%).    Spirometry consistent with normal pattern.   Allergy Studies: none       Malachi Bonds, MD  [facility]

## 2020-03-25 NOTE — Patient Instructions (Addendum)
1. Coughing - Lung testing looked fairly normal today. - However, we are going to see whether this controller medication can help your cough. - Use one puff twice daily of the AirDuo. - Wash your mouth out after each use.  - I would consider the diagnosis of vocal cord dysfunction is the controller medication does not help. - I do not think that the reflux is involved since these medications never helped at all.   2. Non-allergic rhinitis - We are going to get skin testing on Thursday 03/27/20 at 3:00pm. - I think we are going to know more after this testing is performed.  - We can try changing some things around at that point to get to the bottom of this mucous production.   3. Return in about 2 days (around 03/27/2020).    Please inform us of any Emergency Department visits, hospitalizations, or changes in symptoms. Call us before going to the ED for breathing or allergy symptoms since we might be able to fit you in for a sick visit. Feel free to contact us anytime with any questions, problems, or concerns.  It was a pleasure to meet you today! HANG IN THERE! We will figure this out!   Websites that have reliable patient information: 1. American Academy of Asthma, Allergy, and Immunology: www.aaaai.org 2. Food Allergy Research and Education (FARE): foodallergy.org 3. Mothers of Asthmatics: http://www.asthmacommunitynetwork.org 4. American College of Allergy, Asthma, and Immunology: www.acaai.org   COVID-19 Vaccine Information can be found at: PodExchange.nl For questions related to vaccine distribution or appointments, please email [email or web address] or call [phone].     "Like" [place] on Facebook and Instagram for our latest updates!       HAPPY SPRING!  Make sure you are registered to vote! If you have moved or changed any of your contact information, you will need to get this updated before voting!  In some  cases, you MAY be able to register to vote online: [email or web address]

## 2020-03-26 ENCOUNTER — Encounter: Payer: Self-pay | Admitting: Allergy & Immunology

## 2020-03-27 ENCOUNTER — Other Ambulatory Visit: Payer: Self-pay

## 2020-03-27 ENCOUNTER — Ambulatory Visit (INDEPENDENT_AMBULATORY_CARE_PROVIDER_SITE_OTHER): Payer: BC Managed Care – PPO | Admitting: Allergy & Immunology

## 2020-03-27 VITALS — BP 112/64 | HR 88 | Temp 97.8°F | Resp 19

## 2020-03-27 DIAGNOSIS — J302 Other seasonal allergic rhinitis: Secondary | ICD-10-CM | POA: Diagnosis not present

## 2020-03-27 DIAGNOSIS — J3089 Other allergic rhinitis: Secondary | ICD-10-CM

## 2020-03-27 DIAGNOSIS — J310 Chronic rhinitis: Secondary | ICD-10-CM

## 2020-03-27 MED ORDER — XHANCE 93 MCG/ACT NA EXHU: 2.0000 | INHALANT_SUSPENSION | Freq: Two times a day (BID) | NASAL | 5 refills | Status: DC

## 2020-03-27 MED ORDER — CARBINOXAMINE MALEATE 6 MG PO TABS: 1.0000 | ORAL_TABLET | Freq: Three times a day (TID) | ORAL | 2 refills | Status: DC | PRN

## 2020-03-27 NOTE — Patient Instructions (Addendum)
1. Coughing - We are going to continue with AirDuo one puff once twice daily. - It seems that this has helped your symptoms. - We will continue for now.   2. Perennial and seasonal allergic rhinitis - Testing was positive to grasses, weeds, trees, outdoor molds, and dust mites. - Avoidance measures provided.  - Start Xhance two sprays per nostril daily. - Video provided. - We will send in the script to the [facility] outpatient pharmacy, and they will call you to confirm your shipping address. - You can review how to use the device here: https://www.xhance.com - Ask to be enrolled in the auto-refill program so you can get a year for free. - Start Ryvent one tablet every 8 hours as needed to help dry up the mucous.  - Consider nasal saline rinses.   3. Return in about 3 months (around 06/26/2020).    Please inform us of any Emergency Department visits, hospitalizations, or changes in symptoms. Call us before going to the ED for breathing or allergy symptoms since we might be able to fit you in for a sick visit. Feel free to contact us anytime with any questions, problems, or concerns.  It was a pleasure to see you again today! I think we are getting there!   Websites that have reliable patient information: 1. American Academy of Asthma, Allergy, and Immunology: www.aaaai.org 2. Food Allergy Research and Education (FARE): foodallergy.org 3. Mothers of Asthmatics: http://www.asthmacommunitynetwork.org 4. American College of Allergy, Asthma, and Immunology: www.acaai.org   COVID-19 Vaccine Information can be found at: ShippingScam.co.uk For questions related to vaccine distribution or appointments, please email [email or web address] or call [phone].     "Like" [place] on Facebook and Instagram for our latest updates!       HAPPY SPRING!  Make sure you are registered to vote! If you have moved or changed any of your contact  information, you will need to get this updated before voting!  In some cases, you MAY be able to register to vote online: [email or web address]     Reducing Pollen Exposure  The American Academy of Allergy, Asthma and Immunology suggests the following steps to reduce your exposure to pollen during allergy seasons.    1. Do not hang sheets or clothing out to dry; pollen may collect on these items. 2. Do not mow lawns or spend time around freshly cut grass; mowing stirs up pollen. 3. Keep windows closed at night.  Keep car windows closed while driving. 4. Minimize morning activities outdoors, a time when pollen counts are usually at their highest. 5. Stay indoors as much as possible when pollen counts or humidity is high and on windy days when pollen tends to remain in the air longer. 6. Use air conditioning when possible.  Many air conditioners have filters that trap the pollen spores. 7. Use a HEPA room air filter to remove pollen form the indoor air you breathe.  Control of Mold Allergen   Mold and fungi can grow on a variety of surfaces provided certain temperature and moisture conditions exist.  Outdoor molds grow on plants, decaying vegetation and soil.  The major outdoor mold, Alternaria and Cladosporium, are found in very high numbers during hot and dry conditions.  Generally, a late Summer - Fall peak is seen for common outdoor fungal spores.  Rain will temporarily lower outdoor mold spore count, but counts rise rapidly when the rainy period ends.  The most important indoor molds are Aspergillus and Penicillium.  Dark, humid and poorly ventilated  basements are ideal sites for mold growth.  The next most common sites of mold growth are the bathroom and the kitchen.  Outdoor (Seasonal) Mold Control  Positive outdoor molds via skin testing: Alternaria, Cladosporium, Bipolaris (Helminthsporium), Drechslera (Curvalaria) and Mucor  1. Use air conditioning and  keep windows closed 2. Avoid exposure to decaying vegetation. 3. Avoid leaf raking. 4. Avoid grain handling. 5. Consider wearing a face mask if working in moldy areas.       Control of Dust Mite Allergen    Dust mites play a major role in allergic asthma and rhinitis.  They occur in environments with high humidity wherever human skin is found.  Dust mites absorb humidity from the atmosphere (ie, they do not drink) and feed on organic matter (including shed human and animal skin).  Dust mites are a microscopic type of insect that you cannot see with the naked eye.  High levels of dust mites have been detected from mattresses, pillows, carpets, upholstered furniture, bed covers, clothes, soft toys and any woven material.  The principal allergen of the dust mite is found in its feces.  A gram of dust may contain 1,000 mites and 250,000 fecal particles.  Mite antigen is easily measured in the air during house cleaning activities.  Dust mites do not bite and do not cause harm to humans, other than by triggering allergies/asthma.    Ways to decrease your exposure to dust mites in your home:  1. Encase mattresses, box springs and pillows with a mite-impermeable barrier or cover   2. Wash sheets, blankets and drapes weekly in hot water (130 F) with detergent and dry them in a dryer on the hot setting.  3. Have the room cleaned frequently with a vacuum cleaner and a damp dust-mop.  For carpeting or rugs, vacuuming with a vacuum cleaner equipped with a high-efficiency particulate air (HEPA) filter.  The dust mite allergic individual should not be in a room which is being cleaned and should wait 1 hour after cleaning before going into the room. 4. Do not sleep on upholstered furniture (eg, couches).   5. If possible removing carpeting, upholstered furniture and drapery from the home is ideal.  Horizontal blinds should be eliminated in the rooms where the person spends the most time (bedroom, study,  television room).  Washable vinyl, roller-type shades are optimal. 6. Remove all non-washable stuffed toys from the bedroom.  Wash stuffed toys weekly like sheets and blankets above.   7. Reduce indoor humidity to less than 50%.  Inexpensive humidity monitors can be purchased at most hardware stores.  Do not use a humidifier as can make the problem worse and are not recommended.  Allergy Shots   Allergies are the result of a chain reaction that starts in the immune system. Your immune system controls how your body defends itself. For instance, if you have an allergy to pollen, your immune system identifies pollen as an invader or allergen. Your immune system overreacts by producing antibodies called Immunoglobulin E (IgE). These antibodies travel to cells that release chemicals, causing an allergic reaction.  The concept behind allergy immunotherapy, whether it is received in the form of shots or tablets, is that the immune system can be desensitized to specific allergens that trigger allergy symptoms. Although it requires time and patience, the payback can be long-term relief.  How Do Allergy Shots Work?  Allergy shots work much like a vaccine. Your body responds to injected amounts of a particular allergen given  in increasing doses, eventually developing a resistance and tolerance to it. Allergy shots can lead to decreased, minimal or no allergy symptoms.  There generally are two phases: build-up and maintenance. Build-up often ranges from three to six months and involves receiving injections with increasing amounts of the allergens. The shots are typically given once or twice a week, though more rapid build-up schedules are sometimes used.  The maintenance phase begins when the most effective dose is reached. This dose is different for each person, depending on how allergic you are and your response to the build-up injections. Once the maintenance dose is reached, there are longer periods between  injections, typically two to four weeks.  Occasionally doctors give cortisone-type shots that can temporarily reduce allergy symptoms. These types of shots are different and should not be confused with allergy immunotherapy shots.  Who Can Be Treated with Allergy Shots?  Allergy shots may be a good treatment approach for people with allergic rhinitis (hay fever), allergic asthma, conjunctivitis (eye allergy) or stinging insect allergy.   Before deciding to begin allergy shots, you should consider:  . The length of allergy season and the severity of your symptoms . Whether medications and/or changes to your environment can control your symptoms . Your desire to avoid long-term medication use . Time: allergy immunotherapy requires a major time commitment . Cost: may vary depending on your insurance coverage  Allergy shots for children age 44 and older are effective and often well tolerated. They might prevent the onset of new allergen sensitivities or the progression to asthma.  Allergy shots are not started on patients who are pregnant but can be continued on patients who become pregnant while receiving them. In some patients with other medical conditions or who take certain common medications, allergy shots may be of risk. It is important to mention other medications you talk to your allergist.   When Will I Feel Better?  Some may experience decreased allergy symptoms during the build-up phase. For others, it may take as long as 12 months on the maintenance dose. If there is no improvement after a year of maintenance, your allergist will discuss other treatment options with you.  If you aren't responding to allergy shots, it may be because there is not enough dose of the allergen in your vaccine or there are missing allergens that were not identified during your allergy testing. Other reasons could be that there are high levels of the allergen in your environment or major exposure to  non-allergic triggers like tobacco smoke.  What Is the Length of Treatment?  Once the maintenance dose is reached, allergy shots are generally continued for three to five years. The decision to stop should be discussed with your allergist at that time. Some people may experience a permanent reduction of allergy symptoms. Others may relapse and a longer course of allergy shots can be considered.  What Are the Possible Reactions?  The two types of adverse reactions that can occur with allergy shots are local and systemic. Common local reactions include very mild redness and swelling at the injection site, which can happen immediately or several hours after. A systemic reaction, which is less common, affects the entire body or a particular body system. They are usually mild and typically respond quickly to medications. Signs include increased allergy symptoms such as sneezing, a stuffy nose or hives.  Rarely, a serious systemic reaction called anaphylaxis can develop. Symptoms include swelling in the throat, wheezing, a feeling of tightness in the  chest, nausea or dizziness. Most serious systemic reactions develop within 30 minutes of allergy shots. This is why it is strongly recommended you wait in your doctor's office for 30 minutes after your injections. Your allergist is trained to watch for reactions, and his or her staff is trained and equipped with the proper medications to identify and treat them.  Who Should Administer Allergy Shots?  The preferred location for receiving shots is your prescribing allergist's office. Injections can sometimes be given at another facility where the physician and staff are trained to recognize and treat reactions, and have received instructions by your prescribing allergist.

## 2020-03-27 NOTE — Progress Notes (Signed)
FOLLOW UP  Date of Service/Encounter:  03/27/20   Assessment:   Seasonal and perennial allergic rhinitis (grasses, weeds, trees, outdoor molds, and dust mites)  Coughing - improve with the combined ICS/LABA  Plan/Recommendations:   1. Coughing - We are going to continue with AirDuo one puff once twice daily. - It seems that this has helped your symptoms. - We will continue for now.   2. Perennial and seasonal allergic rhinitis - Testing was positive to grasses, weeds, trees, outdoor molds, and dust mites. - Avoidance measures provided.  - Start Xhance two sprays per nostril daily. - Video provided. - We will send in the script to the [facility] outpatient pharmacy, and they will call you to confirm your shipping address. - You can review how to use the device here: https://www.xhance.com - Ask to be enrolled in the auto-refill program so you can get a year for free. - Start Ryvent one tablet every 8 hours as needed to help dry up the mucous.  - Consider nasal saline rinses.   3. Return in about 3 months (around 06/26/2020).    Subjective:   Susan Jefferson is a 49 y.o. female presenting today for follow up of  Chief Complaint  Patient presents with  . Allergy Testing    Susan Jefferson has a history of the following: Patient Active Problem List   Diagnosis Date Noted  . Acute maxillary sinusitis 01/01/2019  . Eustachian tube dysfunction, bilateral 11/02/2018  . Coughing 11/01/2018  . Non-allergic rhinitis 11/01/2018    History obtained from: chart review and patient.  Susan Jefferson is a 49 y.o. female presenting for skin testing.  She was last seen on April 20.  At that time, she was interested in another evaluation for her cough and rhinitis.  We decided to schedule her for intradermal testing since she had already had prick testing and blood testing.  We put her on prednisone through the weekend in order to allow her to get off of her antihistamines.  We also started air  duo 1 puff twice daily to see if this helps with any of the coughing.  Since last visit, she has done well.  She does report that the inhaler has helped her chest open up and she is able to breathe better.  She is interested in continuing with this.  Otherwise, there have been no changes to her past medical history, surgical history, family history, or social history.    Review of Systems  Constitutional: Negative.  Negative for chills, fever, malaise/fatigue and weight loss.  HENT: Positive for congestion and sinus pain. Negative for ear discharge and ear pain.   Eyes: Negative for pain, discharge and redness.  Respiratory: Positive for cough (improved). Negative for sputum production, shortness of breath and wheezing.   Cardiovascular: Negative.  Negative for chest pain and palpitations.  Gastrointestinal: Negative for abdominal pain, heartburn, nausea and vomiting.  Skin: Negative.  Negative for itching and rash.  Neurological: Negative for dizziness and headaches.  Endo/Heme/Allergies: Negative for environmental allergies. Does not bruise/bleed easily.       Objective:   Blood pressure 112/64, pulse 88, temperature 97.8 F (36.6 C), temperature source Temporal, resp. rate 19, SpO2 96 %. There is no height or weight on file to calculate BMI.   Physical Exam: deferred since this was a skin testing appointment only   Diagnostic studies:   Allergy Studies:    Intradermal - 03/27/20 1518    Time Antigen Placed  1518  Allergen Manufacturer  Greer    Location  Arm    Number of Test  15    Intradermal  Select    Control  Negative    [place]  1+    [place]  1+    7 Grass  Negative    Ragweed mix  Negative    Weed mix  2+    Tree mix  2+    Mold 1  1+    Mold 2  Negative    Mold 3  3+    Mold 4  Negative    Cat  Negative    Dog  Negative    Cockroach  Negative    Mite mix  1+       Allergy testing results were read and interpreted by myself, documented by  clinical staff.      Salvatore Marvel, MD  [facility]

## 2020-03-30 ENCOUNTER — Encounter: Payer: Self-pay | Admitting: Allergy & Immunology

## 2020-03-30 MED ORDER — AIRDUO DIGIHALER 113-14 MCG/ACT IN AEPB: 1.0000 | INHALATION_SPRAY | Freq: Two times a day (BID) | RESPIRATORY_TRACT | 5 refills | Status: DC

## 2020-06-10 ENCOUNTER — Encounter: Payer: Self-pay | Admitting: Family

## 2020-06-10 ENCOUNTER — Ambulatory Visit: Payer: BC Managed Care – PPO | Admitting: Family

## 2020-06-10 ENCOUNTER — Other Ambulatory Visit: Payer: Self-pay

## 2020-06-10 VITALS — BP 116/82 | HR 83 | Temp 97.1°F | Resp 14 | Ht 64.0 in | Wt 213.6 lb

## 2020-06-10 DIAGNOSIS — R05 Cough: Secondary | ICD-10-CM | POA: Diagnosis not present

## 2020-06-10 DIAGNOSIS — J302 Other seasonal allergic rhinitis: Secondary | ICD-10-CM | POA: Diagnosis not present

## 2020-06-10 DIAGNOSIS — J3089 Other allergic rhinitis: Secondary | ICD-10-CM

## 2020-06-10 DIAGNOSIS — J0100 Acute maxillary sinusitis, unspecified: Secondary | ICD-10-CM

## 2020-06-10 DIAGNOSIS — R059 Cough, unspecified: Secondary | ICD-10-CM

## 2020-06-10 MED ORDER — RYVENT 6 MG PO TABS: 1.0000 | ORAL_TABLET | Freq: Three times a day (TID) | ORAL | 2 refills | Status: DC | PRN

## 2020-06-10 MED ORDER — LEVALBUTEROL TARTRATE 45 MCG/ACT IN AERO: 2.0000 | INHALATION_SPRAY | Freq: Four times a day (QID) | RESPIRATORY_TRACT | 1 refills | Status: DC | PRN

## 2020-06-10 MED ORDER — AMOXICILLIN-POT CLAVULANATE 875-125 MG PO TABS: 1.0000 | ORAL_TABLET | Freq: Two times a day (BID) | ORAL | 0 refills | Status: AC

## 2020-06-10 NOTE — Progress Notes (Signed)
[address] [place] [address] Dept: [phone]  FOLLOW UP NOTE  Patient ID: Susan Jefferson, female    DOB: 01/17/1971  Age: 49 y.o. MRN: [id number] Date of Office Visit: 06/10/2020  Assessment  Chief Complaint: Cough (started a week ago, some chest tightness)  HPI Susan Jefferson is a 49 year old female who presents for an acute visit today.  She was last seen on March 27, 2020 by Dr. Dellis Anes for coughing and perennial and seasonal allergic rhinitis.  She reports that her cough was pretty much gone until 2 weeks ago.  She continues on AirDuo 113 -1 puff twice a day.  She reports that her cough is productive and she usually does not look at the color of the sputum, but this morning she did notice it was white in color.  She also reports tightness in her chest, wheezing,and shortness of breath.  She denies any nocturnal symptoms, use of systemic steroids since her last office visit or trips to the emergency room or urgent care due to breathing problems.  She does not have a rescue inhaler.  Albuterol does cause her to have palpitations.  She did have a chest x-ray on December 08, 2018 that showed no active cardiopulmonary disease.  Perennial and seasonal allergic rhinitis is reported as not well controlled with the use of XHANCE 2 sprays each nostril each day.  She did not ever get the RyVent that was prescribed at the last  office visit.  She reports rhinorrhea that is at times white, clear, or bright green.  She reports this color varies each day, but reports that when it is green in color it is sticky and chunky in consistency.  She also reports nasal congestion and postnasal drip.  She reports the postnasal drip occurs all the time.  She denies any sinus tenderness, fever or chills.  Her last sinus CT from May 25, 2019 shows diffuse mucosal edema throughout the nasal paranasal sinuses.  Air-fluid level left maxillary and sphenoid sinus.  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  She reports that she had sinus surgery in July 2020 and her last follow-up with ENT was around September or October 2020.  She has tried sinus rinse in the past, but it did not go anywhere due to her nose being so congested.  She denies any symptoms of heartburn or reflux.  In 2019 she was on Protonix 40 mg once a day with no benefit of cough.  Current medications are as listed in the chart.   Drug Allergies:  Allergies  Allergen Reactions  . Succinylcholine     Pt states that after one surgery it was difficult to wake her mother up. Her mother was told to never take succinylcholine again and to tell her children this.      Review of Systems: Review of Systems  Constitutional: Negative for chills, fever and weight loss.  HENT: Positive for congestion. Negative for nosebleeds, sinus pain and sore throat.   Eyes: Negative for blurred vision.  Respiratory: Positive for cough, sputum production, shortness of breath and wheezing. Negative for hemoptysis.   Cardiovascular: Negative for chest pain and palpitations.  Gastrointestinal: Negative for heartburn and nausea.  Genitourinary: Negative for dysuria.  Neurological: Negative for headaches.  Endo/Heme/Allergies: Positive for environmental allergies.    Physical Exam: BP 116/82   Pulse 83   Temp (!) 97.1 F (36.2 C)   Resp 14   Ht 5\' 4"  (1.626 m)  Wt 213 lb 9.6 oz (96.9 kg)   SpO2 97%   BMI 36.66 kg/m    Physical Exam Constitutional:      Appearance: Normal appearance.  HENT:     Head: Normocephalic and atraumatic.     Right Ear: Tympanic membrane, ear canal and external ear normal.     Left Ear: Tympanic membrane, ear canal and external ear normal.     Ears:     Comments: Eyes normal. Ears normal. Nose normal. Pharynx normal    Nose: Nose normal.  Cardiovascular:     Rate and Rhythm: Normal rate and regular rhythm.     Pulses: Normal pulses.     Heart sounds:  Normal heart sounds.  Pulmonary:     Effort: Pulmonary effort is normal.     Breath sounds: Normal breath sounds.     Comments: Lungs clear to auscultation Musculoskeletal:     Cervical back: Neck supple.  Skin:    General: Skin is warm.  Neurological:     Mental Status: She is alert and oriented to person, place, and time.  Psychiatric:        Mood and Affect: Mood normal.        Behavior: Behavior normal.        Thought Content: Thought content normal.        Judgment: Judgment normal.     Diagnostics: FVC 2.96 L, FEV1 2.82 L.  Predicted FVC 3.62 L, FEV1 2.89 L.  Spirometry indicates normal ventilatory function.  Assessment and Plan: 1. Acute maxillary sinusitis, recurrence not specified   2. Coughing   3. Seasonal and perennial allergic rhinitis     No orders of the defined types were placed in this encounter.   Patient Instructions  Allergic rhinitis Continue Xhance 2 sprays each nostril once a day as needed for stuffy nose. Start Ryvent one tablet once every 8 hours as needed to help dry mucous.  Acute sinusitis Start Augmentin 875 mg-take 1 tablet twice a day for 10 days Start prednisone 10 mg- take one tablet once a day for 10 days If you do not get better consider sinus CT  Cough Continue AirDuo one puff twice a day to help with cough. Start Xopenex use 2 puffs every 6 hours as needed for wheeze, tightness in chest, cough, or shortness of breath.  Please let us know if this treatment plan is not working well for you. Schedule follow up appointment in 2 weeks   Return in about 2 weeks (around 06/24/2020), or if symptoms worsen or fail to improve.    Thank you for the opportunity to care for this patient.  Please do not hesitate to contact me with questions.  Nehemiah Settle, FNP [facility]

## 2020-06-10 NOTE — Patient Instructions (Addendum)
Allergic rhinitis Continue Xhance 2 sprays each nostril once a day as needed for stuffy nose. Start Ryvent one tablet once every 8 hours as needed to help dry mucous.  Acute sinusitis Start Augmentin 875 mg-take 1 tablet twice a day for 10 days Start prednisone 10 mg- take one tablet once a day for 10 days If you do not get better consider sinus CT  Cough Continue AirDuo one puff twice a day to help with cough. Start Xopenex use 2 puffs every 6 hours as needed for wheeze, tightness in chest, cough, or shortness of breath.  Please let us know if this treatment plan is not working well for you. Schedule follow up appointment in 2 weeks

## 2020-07-01 ENCOUNTER — Ambulatory Visit (INDEPENDENT_AMBULATORY_CARE_PROVIDER_SITE_OTHER): Payer: BC Managed Care – PPO | Admitting: Allergy & Immunology

## 2020-07-01 ENCOUNTER — Other Ambulatory Visit: Payer: Self-pay

## 2020-07-01 ENCOUNTER — Encounter: Payer: Self-pay | Admitting: Allergy & Immunology

## 2020-07-01 VITALS — BP 104/62 | HR 88 | Resp 18 | Ht 64.0 in

## 2020-07-01 DIAGNOSIS — J302 Other seasonal allergic rhinitis: Secondary | ICD-10-CM | POA: Diagnosis not present

## 2020-07-01 DIAGNOSIS — J3089 Other allergic rhinitis: Secondary | ICD-10-CM | POA: Diagnosis not present

## 2020-07-01 DIAGNOSIS — J4540 Moderate persistent asthma, uncomplicated: Secondary | ICD-10-CM

## 2020-07-01 HISTORY — DX: Moderate persistent asthma, uncomplicated: J45.40

## 2020-07-01 MED ORDER — AIRDUO DIGIHALER 113-14 MCG/ACT IN AEPB: 1.0000 | INHALATION_SPRAY | Freq: Two times a day (BID) | RESPIRATORY_TRACT | 5 refills | Status: DC

## 2020-07-01 NOTE — Patient Instructions (Addendum)
1. Moderate persistent allergic asthma, uncomplicated - Lung testing looked normal today.  - We are going to continue with AirDuo one puff once twice daily. - Continue with albuterol as needed.  - We will continue for now.   2. Perennial and seasonal allergic rhinitis (grasses, weeds, trees, outdoor molds, dust mites) - Continue with Xhance two sprays per nostril daily. - Samples of Ryvent provided (use every 8 hours as needed). - This will help to dry out your nose. - Call us if you like this and we can send it in.  - If this is not working, we can do allergy shots.  - Information on allergy shots provided.    3. Return in about 3 months (around 10/01/2020). This can be an in-person, a virtual Webex or a telephone follow up visit.   Please inform us of any Emergency Department visits, hospitalizations, or changes in symptoms. Call us before going to the ED for breathing or allergy symptoms since we might be able to fit you in for a sick visit. Feel free to contact us anytime with any questions, problems, or concerns.  It was a pleasure to see you again today!  Websites that have reliable patient information: 1. American Academy of Asthma, Allergy, and Immunology: www.aaaai.org 2. Food Allergy Research and Education (FARE): foodallergy.org 3. Mothers of Asthmatics: http://www.asthmacommunitynetwork.org 4. American College of Allergy, Asthma, and Immunology: www.acaai.org   COVID-19 Vaccine Information can be found at: [email or web address] For questions related to vaccine distribution or appointments, please email [email or web address] or call [phone].     "Like" [place] on Facebook and Instagram for our latest updates!        Make sure you are registered to vote! If you have moved or changed any of your contact information, you will need to get this updated before voting!  In some cases, you MAY be able to register to  vote online: [email or web address]    Allergy Shots   Allergies are the result of a chain reaction that starts in the immune system. Your immune system controls how your body defends itself. For instance, if you have an allergy to pollen, your immune system identifies pollen as an invader or allergen. Your immune system overreacts by producing antibodies called Immunoglobulin E (IgE). These antibodies travel to cells that release chemicals, causing an allergic reaction.  The concept behind allergy immunotherapy, whether it is received in the form of shots or tablets, is that the immune system can be desensitized to specific allergens that trigger allergy symptoms. Although it requires time and patience, the payback can be long-term relief.  How Do Allergy Shots Work?  Allergy shots work much like a vaccine. Your body responds to injected amounts of a particular allergen given in increasing doses, eventually developing a resistance and tolerance to it. Allergy shots can lead to decreased, minimal or no allergy symptoms.  There generally are two phases: build-up and maintenance. Build-up often ranges from three to six months and involves receiving injections with increasing amounts of the allergens. The shots are typically given once or twice a week, though more rapid build-up schedules are sometimes used.  The maintenance phase begins when the most effective dose is reached. This dose is different for each person, depending on how allergic you are and your response to the build-up injections. Once the maintenance dose is reached, there are longer periods between injections, typically two to four weeks.  Occasionally doctors give cortisone-type shots that can temporarily reduce allergy symptoms. These types of shots  are different and should not be confused with allergy immunotherapy shots.  Who Can Be Treated with Allergy Shots?  Allergy shots may be a good treatment  approach for people with allergic rhinitis (hay fever), allergic asthma, conjunctivitis (eye allergy) or stinging insect allergy.   Before deciding to begin allergy shots, you should consider:  . The length of allergy season and the severity of your symptoms . Whether medications and/or changes to your environment can control your symptoms . Your desire to avoid long-term medication use . Time: allergy immunotherapy requires a major time commitment . Cost: may vary depending on your insurance coverage  Allergy shots for children age 5 and older are effective and often well tolerated. They might prevent the onset of new allergen sensitivities or the progression to asthma.  Allergy shots are not started on patients who are pregnant but can be continued on patients who become pregnant while receiving them. In some patients with other medical conditions or who take certain common medications, allergy shots may be of risk. It is important to mention other medications you talk to your allergist.   When Will I Feel Better?  Some may experience decreased allergy symptoms during the build-up phase. For others, it may take as long as 12 months on the maintenance dose. If there is no improvement after a year of maintenance, your allergist will discuss other treatment options with you.  If you aren't responding to allergy shots, it may be because there is not enough dose of the allergen in your vaccine or there are missing allergens that were not identified during your allergy testing. Other reasons could be that there are high levels of the allergen in your environment or major exposure to non-allergic triggers like tobacco smoke.  What Is the Length of Treatment?  Once the maintenance dose is reached, allergy shots are generally continued for three to five years. The decision to stop should be discussed with your allergist at that time. Some people may experience a permanent reduction of allergy  symptoms. Others may relapse and a longer course of allergy shots can be considered.  What Are the Possible Reactions?  The two types of adverse reactions that can occur with allergy shots are local and systemic. Common local reactions include very mild redness and swelling at the injection site, which can happen immediately or several hours after. A systemic reaction, which is less common, affects the entire body or a particular body system. They are usually mild and typically respond quickly to medications. Signs include increased allergy symptoms such as sneezing, a stuffy nose or hives.  Rarely, a serious systemic reaction called anaphylaxis can develop. Symptoms include swelling in the throat, wheezing, a feeling of tightness in the chest, nausea or dizziness. Most serious systemic reactions develop within 30 minutes of allergy shots. This is why it is strongly recommended you wait in your doctor's office for 30 minutes after your injections. Your allergist is trained to watch for reactions, and his or her staff is trained and equipped with the proper medications to identify and treat them.  Who Should Administer Allergy Shots?  The preferred location for receiving shots is your prescribing allergist's office. Injections can sometimes be given at another facility where the physician and staff are trained to recognize and treat reactions, and have received instructions by your prescribing allergist.

## 2020-07-01 NOTE — Progress Notes (Signed)
FOLLOW UP  Date of Service/Encounter:  07/01/20   Assessment:   Moderate persistent asthma, uncomplicated  Seasonal and perennial allergic rhinitis (grasses, weeds, trees, outdoor molds, dust mites)  Plan/Recommendations:   1. Moderate persistent allergic asthma, uncomplicated - Lung testing looked normal today.  - We are going to continue with AirDuo one puff once twice daily. - Continue with albuterol as needed.  - We will continue for now.   2. Perennial and seasonal allergic rhinitis (grasses, weeds, trees, outdoor molds, dust mites) - Continue with Xhance two sprays per nostril daily. - Samples of Ryvent provided (use every 8 hours as needed). - This will help to dry out your nose. - Call us if you like this and we can send it in.  - If this is not working, we can do allergy shots.  - Information on allergy shots provided.    3. Return in about 3 months (around 10/01/2020). This can be an in-person, a virtual Webex or a telephone follow up visit.  Subjective:   Susan Jefferson is a 49 y.o. female presenting today for follow up of  Chief Complaint  Patient presents with   Allergic Rhinitis    Cough   Asthma    Devin Matsuura has a history of the following: Patient Active Problem List   Diagnosis Date Noted   Moderate persistent asthma, uncomplicated 07/01/2020   Seasonal and perennial allergic rhinitis 07/01/2020   Acute maxillary sinusitis 01/01/2019   Eustachian tube dysfunction, bilateral 11/02/2018   Coughing 11/01/2018   Non-allergic rhinitis 11/01/2018    History obtained from: chart review and patient.  Susan Jefferson is a 49 y.o. female presenting for a follow up visit. She was last seen by me in April 2021. At that time, we continued with Air Duo 113/78mcg one puff once daily. She had testing that was positive to grasses, weeds, trees, outdoor molds, and dust mites. We started [facility] 2 sprays per nostril daily. We also started RyVent 6 mg every  8 hours as needed.  In the interim, she saw Chrissie our NP for a sinus infection. She was started on Augmentin BID and prednisone.   Since the last visit, she has done very well.   Asthma/Respiratory Symptom History: She has remained on the AirDuo one puff twice daily. She is using the breathing treatments and feels like they are working well. She was doing well until the beginning of the July, which is when she was diagnosed with a sinus infection. She completed the antibiotics and felt a a lot better.  Allergic Rhinitis Symptom History: She is on the [place] and it is "drying stuff up" but it seems to be a problem with over drying/. She is having a hard time with increased viscocity of her mucous and boogers. She needs to eat jalapenos to thin out her mucous and allow it to come out. The mucous is her main concern. She has a chronic cough that has definitely been worse. She feels that she has gotten rid of 80% of her cough. She does realize that she has allergic asthma. She does not spend much time outdoors and changes her air filters and uses her HVAC all of the time. She does not open the windows. She is motivated to do something to make all of the mucous go away.   Otherwise, there have been no changes to her past medical history, surgical history, family history, or social history.    Review of Systems  Constitutional: Negative.  Negative  for chills, fever, malaise/fatigue and weight loss.  HENT: Positive for congestion and sinus pain. Negative for ear discharge and ear pain.   Eyes: Negative for pain, discharge and redness.  Respiratory: Positive for cough. Negative for sputum production, shortness of breath and wheezing.   Cardiovascular: Negative.  Negative for chest pain and palpitations.  Gastrointestinal: Negative for abdominal pain, constipation, diarrhea, heartburn, nausea and vomiting.  Skin: Negative.  Negative for itching and rash.  Neurological: Negative for dizziness and  headaches.  Endo/Heme/Allergies: Positive for environmental allergies. Does not bruise/bleed easily.       Objective:   Blood pressure (!) 104/62, pulse 88, resp. rate 18, height 5\' 4"  (1.626 m), SpO2 97 %. Body mass index is 36.66 kg/m.   Physical Exam:  Physical Exam Constitutional:      Appearance: She is well-developed.     Comments: Very curious female.  Has lots of questions.  HENT:     Head: Normocephalic and atraumatic.     Right Ear: Tympanic membrane, ear canal and external ear normal.     Left Ear: Tympanic membrane and ear canal normal.     Nose: No nasal deformity, septal deviation, mucosal edema or rhinorrhea.     Right Sinus: No maxillary sinus tenderness or frontal sinus tenderness.     Left Sinus: No maxillary sinus tenderness or frontal sinus tenderness.     Mouth/Throat:     Mouth: Mucous membranes are not pale and not dry.     Pharynx: Uvula midline.  Eyes:     General:        Right eye: No discharge.        Left eye: No discharge.     Conjunctiva/sclera: Conjunctivae normal.     Right eye: Right conjunctiva is not injected. No chemosis.    Left eye: Left conjunctiva is not injected. No chemosis.    Pupils: Pupils are equal, round, and reactive to light.  Cardiovascular:     Rate and Rhythm: Normal rate and regular rhythm.     Heart sounds: Normal heart sounds.  Pulmonary:     Effort: Pulmonary effort is normal. No tachypnea, accessory muscle usage or respiratory distress.     Breath sounds: Normal breath sounds. No wheezing, rhonchi or rales.     Comments: Moving air well in all lung fields.  No increased work of breathing. Chest:     Chest wall: No tenderness.  Lymphadenopathy:     Cervical: No cervical adenopathy.  Skin:    Coloration: Skin is not pale.     Findings: No abrasion, erythema, petechiae or rash. Rash is not papular, urticarial or vesicular.     Comments: No eczematous or urticarial lesions noted.  Neurological:     Mental  Status: She is alert.      Diagnostic studies:    Spirometry: results normal (FEV1: 2.53/88%, FVC: 3.28/91%, FEV1/FVC: 77%).    Spirometry consistent with normal pattern.   Allergy Studies: none      08-05-1968, MD  [facility]

## 2020-07-03 ENCOUNTER — Encounter: Payer: Self-pay | Admitting: Allergy & Immunology

## 2020-09-09 ENCOUNTER — Telehealth: Payer: Self-pay | Admitting: Allergy & Immunology

## 2020-09-09 ENCOUNTER — Ambulatory Visit (INDEPENDENT_AMBULATORY_CARE_PROVIDER_SITE_OTHER): Payer: BC Managed Care – PPO | Admitting: Allergy & Immunology

## 2020-09-09 ENCOUNTER — Other Ambulatory Visit: Payer: Self-pay

## 2020-09-09 ENCOUNTER — Encounter: Payer: Self-pay | Admitting: Allergy & Immunology

## 2020-09-09 VITALS — BP 110/60 | HR 83 | Resp 18 | Ht 64.0 in | Wt 209.8 lb

## 2020-09-09 DIAGNOSIS — K9049 Malabsorption due to intolerance, not elsewhere classified: Secondary | ICD-10-CM

## 2020-09-09 DIAGNOSIS — J302 Other seasonal allergic rhinitis: Secondary | ICD-10-CM

## 2020-09-09 DIAGNOSIS — J3089 Other allergic rhinitis: Secondary | ICD-10-CM | POA: Diagnosis not present

## 2020-09-09 DIAGNOSIS — J4540 Moderate persistent asthma, uncomplicated: Secondary | ICD-10-CM

## 2020-09-09 DIAGNOSIS — K219 Gastro-esophageal reflux disease without esophagitis: Secondary | ICD-10-CM

## 2020-09-09 NOTE — Telephone Encounter (Signed)
Please advice . Thank you

## 2020-09-09 NOTE — Telephone Encounter (Signed)
Patient would like to know if she can add peppers to her allergy blood test from today. Patient would like to test a wide range of peppers but if not a wide range, especially green, black, chili and jalapeno peppers. Patient states she will come back if more blood is needed.  Please advise.

## 2020-09-09 NOTE — Progress Notes (Signed)
FOLLOW UP  Date of Service/Encounter:  09/09/20   Assessment:   Moderate persistent asthma, uncomplicated  Seasonal and perennial allergic rhinitis (grasses, weeds, trees, outdoor molds, dust mites)   Adverse food reactions - ? reactive airway dysfunction syndrome versus VCD   [name] presents for follow-up visit.  She is now having reactions to foods.  She says this is been ongoing and likely leading to her cough over the years.  It is just taken a lot of time to put the pieces together.  Unfortunately, she did some allergy testing over the Internet which showed a combination of IgE and IgG mediated food reactions.  She is now very anxious about eating and is very tearful today in the clinic.  I did explain the diagnostic limitations of this testing and she seemed to understand it.  We are going to get some labs today to try to clarify this more fully.  I am not entirely convinced that this is related to the foods, but I am certainly open to the idea.  It could be that the food is leading to worsening reflux.  We started her on Pepcid twice daily to see if this would provide any relief of the symptoms.  I explained to her that this is a histamine blocker and might help some of her allergy symptoms as well.  Regarding her asthma diagnosis.  She does report that the AirDuo has certainly helped her symptoms.  We are to keep this on board for now.  I think a lot of this could be related to reflux versus anxiety, as she is very tearful during the visit today.  Something to consider in the future is possible initiation of Xolair, although we cannot get it approved for asthma since her IgE level was less than 2.  She is not really having any urticaria to speak of, so we cannot get it approved for that either.  Plan/Recommendations:   1. Moderate persistent allergic asthma, uncomplicated - Lung testing deferred today.  - We are going to continue with AirDuo one puff once twice daily. - Continue  with albuterol as needed.  - We will continue for now.   2. Perennial and seasonal allergic rhinitis (grasses, weeds, trees, outdoor molds, dust mites) - Continue with Benadryl as needed.  - Continue with cetirizine daily.   3. Food allergies versus intolerances - We are going to get some labs to look for the most common food allergens. - We are going to look for a red meat sensitivity as well. - We are going to get some labs to look for a citrus allergies.  - We are going to get a serum tryptase to rule out mast cell disease. - EpiPen training and anaphylaxis management plan provided.   4. Possible GERD  - Start Pepcid (famotidine) 40mg  twice daily. - This is a histamine blocker and can hgelp with reflux as well as t  5. Return in about 6 weeks (around 10/21/2020).   Subjective:   Susan Jefferson is a 49 y.o. female presenting today for follow up of  Chief Complaint  Patient presents with  . Asthma  . Food Intolerance    all meats, lemon, banana, oranges or citrus foods    Susan Jefferson has a history of the following: Patient Active Problem List   Diagnosis Date Noted  . Moderate persistent asthma, uncomplicated 07/01/2020  . Seasonal and perennial allergic rhinitis 07/01/2020  . Acute maxillary sinusitis 01/01/2019  . Eustachian tube dysfunction, bilateral  11/02/2018  . Coughing 11/01/2018  . Non-allergic rhinitis 11/01/2018    History obtained from: chart review and patient.  Susan Jefferson is a 49 y.o. female presenting for a follow up visit. She was last seen in July 2021. At that time, her lnug testing looked normal. We continued with AirDuo one puff twice daily as well as albuterol as needed. For her P/SAR, we continued with the Xhance two sprays per nostril daily. We also added on RyVent samples to see if this helped with her postnasal drip. We also discussed doing allergy shots.   Since the last visit, she has mostly done well.  She tells me that her cough has been  her primary concern. She felt that her cough was allergies at first. She thought it was all nasal mediated.  At the last visit, we addressed her postnasal drip aggressively and added on an asthma controller inhaler.   In the interim, she has noticed that "phlegm and mucous are two different things". It seems that she now thinks that her mucous is coming from her lungs rather than from her sinuses. She would take allergy medication when she coughed. She started treating her cough with her rescue inhaler. She could tell a difference with that. Before she started treating with the inhaler, she would cough 300-400 times per day. She describes "coughing with aftershocks".  She is very worried about any permanent damage to her lungs.  Eventually, over time, she has noticed that her coughing gets a lot worse when she is exposed to certain foods. She has reacted to [facility], saltine crackers, and some kind of candy. She realized that citric acid was present in the [facility].  She also was eating a candy and noticed that it had citric acid as well.  She reacted to the candy after eating it with intense coughing. She took Benadryl and this was like a "muscle relaxant".   She did a lab test online and is now confused.  Reviewing the labs, it shows that this is a combination of IgG and IgE testing.  It does not give numbers, but rather ranges ranging from low to high.  She is clearly confused about this and now has a lot of anxiety around eating.  Asthma/Respiratory Symptom History: She does htink that AirDuo is working. But she ran out for a few days.  When she was out of it, her breathing got a lot worse.  She knows that there is an asthma component to her symptoms. She thinks that the allergy component has gotten worse.   Allergic Rhinitis Symptom History: She is not using any of the nasal sprays.  She found over time that they just did not seem to work as well as just Benadryl alone.  She is not using the  RyVent because it over dried her nose.   Otherwise, there have been no changes to her past medical history, surgical history, family history, or social history.    Review of Systems  Constitutional: Negative.  Negative for chills, fever, malaise/fatigue and weight loss.  HENT: Negative for congestion, ear discharge, ear pain and sinus pain.   Eyes: Negative for pain, discharge and redness.  Respiratory: Positive for cough and sputum production. Negative for shortness of breath and wheezing.   Cardiovascular: Negative.  Negative for chest pain and palpitations.  Gastrointestinal: Negative for abdominal pain, constipation, diarrhea, heartburn, nausea and vomiting.  Skin: Negative.  Negative for itching and rash.  Neurological: Negative for dizziness and headaches.  Endo/Heme/Allergies: Positive for environmental allergies. Does not bruise/bleed easily.       Objective:   Blood pressure 110/60, pulse 83, resp. rate 18, height 5\' 4"  (1.626 m), weight 209 lb 12.8 oz (95.2 kg), SpO2 96 %. Body mass index is 36.01 kg/m.   Physical Exam:  Physical Exam Constitutional:      Appearance: She is well-developed.  HENT:     Head: Normocephalic and atraumatic.     Right Ear: Tympanic membrane, ear canal and external ear normal.     Left Ear: Tympanic membrane, ear canal and external ear normal.     Nose: No nasal deformity, septal deviation, mucosal edema or rhinorrhea.     Right Turbinates: Enlarged and swollen.     Left Turbinates: Enlarged and swollen.     Right Sinus: No maxillary sinus tenderness or frontal sinus tenderness.     Left Sinus: No maxillary sinus tenderness or frontal sinus tenderness.     Mouth/Throat:     Mouth: Mucous membranes are not pale and not dry.     Pharynx: Uvula midline.  Eyes:     General:        Right eye: No discharge.        Left eye: No discharge.     Conjunctiva/sclera: Conjunctivae normal.     Right eye: Right conjunctiva is not injected. No  chemosis.    Left eye: Left conjunctiva is not injected. No chemosis.    Pupils: Pupils are equal, round, and reactive to light.  Cardiovascular:     Rate and Rhythm: Normal rate and regular rhythm.     Heart sounds: Normal heart sounds.  Pulmonary:     Effort: Pulmonary effort is normal. No tachypnea, accessory muscle usage or respiratory distress.     Breath sounds: Normal breath sounds. No wheezing, rhonchi or rales.     Comments: Moving air well in all lung fields.  Chest:     Chest wall: No tenderness.  Lymphadenopathy:     Cervical: No cervical adenopathy.  Skin:    Coloration: Skin is not pale.     Findings: No abrasion, erythema, petechiae or rash. Rash is not papular, urticarial or vesicular.  Neurological:     Mental Status: She is alert.      Diagnostic studies: labs sent instead     , MD  [facility]

## 2020-09-09 NOTE — Telephone Encounter (Signed)
Orders added. Placed on Rochelle's desk. Hopefully they can just add them on.   Malachi Bonds, MD [facility]

## 2020-09-09 NOTE — Patient Instructions (Addendum)
1. Moderate persistent allergic asthma, uncomplicated - Lung testing deferred today.  - We are going to continue with AirDuo one puff once twice daily. - Continue with albuterol as needed.  - We will continue for now.   2. Perennial and seasonal allergic rhinitis (grasses, weeds, trees, outdoor molds, dust mites) - Continue with Benadryl as needed.  - Continue with cetirizine daily.   3. Food allergies versus intolerances - We are going to get some labs to look for the most common food allergens. - We are going to look for a red meat sensitivity as well. - We are going to get some labs to look for a citrus allergies.  - We are going to get a serum tryptase to rule out mast cell disease. - EpiPen training and anaphylaxis management plan provided.   4. Possible GERD  - Start Pepcid (famotidine) 40mg  twice daily. - This is a histamine blocker and can hgelp with reflux as well as t  5. Return in about 6 weeks (around 10/21/2020).    Please inform 10/23/2020 of any Emergency Department visits, hospitalizations, or changes in symptoms. Call us before going to the ED for breathing or allergy symptoms since we might be able to fit you in for a sick visit. Feel free to contact us anytime with any questions, problems, or concerns.  It was a pleasure to see today!  Websites that have reliable patient information: 1. American Academy of Asthma, Allergy, and Immunology: www.aaaai.org 2. Food Allergy Research and Education (FARE): foodallergy.org 3. Mothers of Asthmatics: http://www.asthmacommunitynetwork.org 4. American College of Allergy, Asthma, and Immunology: www.acaai.org   COVID-19 Vaccine Information can be found at: [place] For questions related to vaccine distribution or appointments, please email [email or web address] or call [phone].     "Like" [phone] on Facebook and Instagram for our latest updates!         Make sure you are registered to vote! If you have moved or changed any of your contact information, you will need to get this updated before voting!  In some cases, you MAY be able to register to vote online: [place]

## 2020-09-10 ENCOUNTER — Encounter: Payer: Self-pay | Admitting: Allergy & Immunology

## 2020-09-10 MED ORDER — EPINEPHRINE 0.3 MG/0.3ML IJ SOAJ: 0.3000 mg | Freq: Once | INTRAMUSCULAR | 1 refills | Status: AC

## 2020-09-10 NOTE — Telephone Encounter (Signed)
Susan Jefferson has added them on to pts current lab order

## 2020-09-15 ENCOUNTER — Telehealth: Payer: Self-pay

## 2020-09-15 ENCOUNTER — Encounter: Payer: Self-pay | Admitting: Allergy & Immunology

## 2020-09-15 NOTE — Telephone Encounter (Signed)
Is there a reason the two tests (the one from [facility], which was also a blood test and looks at IgE antibody levels, and these tests) are different? Why might the one say I'm allergic to meat and the other say I'm not? Thanks for your help!

## 2020-09-16 LAB — ALLERGEN BANANA: Allergen Banana IgE: <0.1 kU/L

## 2020-09-16 LAB — ALPHA-GAL PANEL
Alpha Gal IgE*: <0.1 kU/L (ref <0.10)
Beef (Bos spp) IgE: <0.1 kU/L (ref <0.35)
Class Interpretation: 0
Class Interpretation: 0
Class Interpretation: 0
Lamb/Mutton (Ovis spp) IgE: <0.1 kU/L (ref <0.35)
Pork (Sus spp) IgE: <0.1 kU/L (ref <0.35)

## 2020-09-16 LAB — MILK COMPONENT PANEL
F076-IgE Alpha Lactalbumin: <0.1 kU/L
F077-IgE Beta Lactoglobulin: <0.1 kU/L
F078-IgE Casein: <0.1 kU/L

## 2020-09-16 LAB — ALLERGEN PROFILE, BASIC FOOD
Allergen Corn, IgE: <0.1 kU/L
Beef IgE: <0.1 kU/L
Chocolate/Cacao IgE: <0.1 kU/L
Egg, Whole IgE: <0.1 kU/L
Food Mix (Seafoods) IgE: NEGATIVE
Milk IgE: <0.1 kU/L
Peanut IgE: <0.1 kU/L
Pork IgE: <0.1 kU/L
Soybean IgE: <0.1 kU/L
Wheat IgE: <0.1 kU/L

## 2020-09-16 LAB — ALLERGEN, ORANGE F33: Orange: <0.1 kU/L

## 2020-09-16 LAB — ALLERGEN, LEMON, F208: Lemon: <0.1 kU/L

## 2020-09-16 LAB — TRYPTASE: Tryptase: 5.6 ug/L (ref 2.2–13.2)

## 2020-09-16 LAB — ALLERGEN, BAKERS YEAST, F45: F045-IgE Yeast: <0.1 kU/L

## 2020-09-18 NOTE — Telephone Encounter (Signed)
LM for pt to call us back about this

## 2020-09-18 NOTE — Telephone Encounter (Signed)
They are both IgE tests, but even the one you got from [facility] showed a fairly low level whereas ours was completely negative.  I am not sure how relevant to this, but the gold standard for diagnosing any kind of food allergy is giving you the food to see how you do.  You noticed yourself that you felt worse when you ate red meats, so I would just continue to avoid red meats.  Malachi Bonds, MD [facility]

## 2020-09-20 LAB — ALLERGEN, BLACK PEPPER,F280: Allergen Black Pepper IgE: <0.1 kU/L

## 2020-09-20 LAB — ALLERGEN,CHILI PEPPER,RF279: F279-IgE Chili Pepper: <0.1 kU/L

## 2020-09-20 LAB — SPECIMEN STATUS REPORT

## 2020-09-20 LAB — JALAPENO PEPPER, IGE
Class Interpretation: 0
Jalapeno Pepper: <0.35 kU/L (ref <0.35)

## 2020-09-20 LAB — ALLERGEN,GRN PEPPER,PAPRIKA,F218: Paprika IgE: <0.1 kU/L

## 2020-10-02 ENCOUNTER — Ambulatory Visit: Payer: BC Managed Care – PPO | Admitting: Allergy & Immunology

## 2020-10-08 ENCOUNTER — Other Ambulatory Visit: Payer: Self-pay | Admitting: Physician Assistant

## 2020-10-08 DIAGNOSIS — Z1231 Encounter for screening mammogram for malignant neoplasm of breast: Secondary | ICD-10-CM

## 2020-10-21 ENCOUNTER — Encounter: Payer: Self-pay | Admitting: Allergy & Immunology

## 2020-10-21 ENCOUNTER — Ambulatory Visit: Payer: BC Managed Care – PPO | Admitting: Allergy & Immunology

## 2020-10-21 ENCOUNTER — Other Ambulatory Visit: Payer: Self-pay

## 2020-10-21 VITALS — BP 112/80 | HR 86 | Temp 97.8°F | Resp 16 | Ht 62.0 in | Wt 217.2 lb

## 2020-10-21 DIAGNOSIS — K219 Gastro-esophageal reflux disease without esophagitis: Secondary | ICD-10-CM

## 2020-10-21 DIAGNOSIS — J4540 Moderate persistent asthma, uncomplicated: Secondary | ICD-10-CM | POA: Diagnosis not present

## 2020-10-21 DIAGNOSIS — J3089 Other allergic rhinitis: Secondary | ICD-10-CM

## 2020-10-21 DIAGNOSIS — J302 Other seasonal allergic rhinitis: Secondary | ICD-10-CM

## 2020-10-21 DIAGNOSIS — R059 Cough, unspecified: Secondary | ICD-10-CM | POA: Diagnosis not present

## 2020-10-21 MED ORDER — FAMOTIDINE 40 MG PO TABS: 40.0000 mg | ORAL_TABLET | Freq: Two times a day (BID) | ORAL | 5 refills | Status: DC

## 2020-10-21 NOTE — Patient Instructions (Addendum)
1. Moderate persistent allergic asthma, uncomplicated - Lung testing deferred today.  - We are going to continue with AirDuo, but increase to the higher dose one. - Use the Xopenex two puffs three times daily to see if this is helping your symptoms at all. - Call us after one week.  - I agree with the Pulmonology appointment.  - Get a pulse ox for your finger to see if this correlates with the watch readings.   2. Perennial and seasonal allergic rhinitis (grasses, weeds, trees, outdoor molds, dust mites) - Continue with Benadryl as needed.  - Continue with cetirizine daily.   3. Possible GERD  - Continue with Protonix twice daily.  - Add on Pepcid twice daily to this as well.   4. Return in about 6 weeks (around 12/02/2020).    Please inform us of any Emergency Department visits, hospitalizations, or changes in symptoms. Call us before going to the ED for breathing or allergy symptoms since we might be able to fit you in for a sick visit. Feel free to contact us anytime with any questions, problems, or concerns.  It was a pleasure to see you again today!  Websites that have reliable patient information: 1. American Academy of Asthma, Allergy, and Immunology: www.aaaai.org 2. Food Allergy Research and Education (FARE): foodallergy.org 3. Mothers of Asthmatics: http://www.asthmacommunitynetwork.org 4. American College of Allergy, Asthma, and Immunology: www.acaai.org   COVID-19 Vaccine Information can be found at: [email or web address] For questions related to vaccine distribution or appointments, please email [email or web address] or call [phone].     "Like" [place] on Facebook and Instagram for our latest updates!     HAPPY FALL!     Make sure you are registered to vote! If you have moved or changed any of your contact information, you will need to get this updated before voting!  In some cases, you MAY be able to  register to vote online: [email or web address]

## 2020-10-21 NOTE — Progress Notes (Signed)
NEW PATIENT  Date of Service/Encounter:  10/21/20  Referring provider: Renford Dills, MD   Assessment:   Moderate persistent asthma, uncomplicated - with Pulmonology referral pending for evaluation of chronic cough  Seasonal and perennial allergic rhinitis(grasses, weeds, trees, outdoor molds, dust mites)   Adverse food reactions - ? reactive airway dysfunction syndrome versus VCD  Plan/Recommendations:   1. Moderate persistent allergic asthma, uncomplicated - Lung testing deferred today.  - We are going to continue with AirDuo, but increase to the higher dose one. - Use the Xopenex two puffs three times daily to see if this is helping your symptoms at all. - Call us after one week.  - I agree with the Pulmonology appointment.  - Get a pulse ox for your finger to see if this correlates with the watch readings.   2. Perennial and seasonal allergic rhinitis (grasses, weeds, trees, outdoor molds, dust mites) - Continue with Benadryl as needed.  - Continue with cetirizine daily.   3. Possible GERD  - Continue with Protonix twice daily.  - Add on Pepcid twice daily to this as well.   4. Return in about 6 weeks (around 12/02/2020).    Subjective:   Susan Jefferson is a 49 y.o. female presenting today for evaluation of  Chief Complaint  Patient presents with  . Follow-up    Susan Jefferson has a history of the following: Patient Active Problem List   Diagnosis Date Noted  . Moderate persistent asthma, uncomplicated 07/01/2020  . Seasonal and perennial allergic rhinitis 07/01/2020  . Acute maxillary sinusitis 01/01/2019  . Eustachian tube dysfunction, bilateral 11/02/2018  . Coughing 11/01/2018  . Non-allergic rhinitis 11/01/2018    History obtained from: chart review and patient.  Susan Jefferson was referred by Renford Dills, MD.     Susan Jefferson is a 49 y.o. female presenting for a follow up visit.  We last saw her in October 2021.  At that time, we deferred  her lung testing.  We continued with AirDUo 1 puff twice daily as well as albuterol as needed.  For her allergic rhinitis, would continue with Benadryl as well as cetirizine.  She was very concerned with food allergies and had done some Internet testing which showed a combination of IgE and IgG to foods.  We did get some labs to look for citrus allergies as well as a basic food panel and an alpha gal panel.  We obtained a serum tryptase as well.  For her possible reflux, we started Pepcid twice daily.  Labs were all negative, including all of the food allergy levels that we sent. Serum tryptase was negative as well.   Since the last visit, she has mostly done well. She went to see GI and she was started on Protonix. This has helped tremendously as far as she can tell. She is going to see her in two months and she is going to tell her that it only lasts around 4 hours. But those four hours have very well controlled symptoms.   She has been taking pictures of her mucous. She has been hacking it and taking pictures of it. She demonstrates in the office today. It is sometimes a milky color and is not necessarily as yellow as the picture she shows me. The expectoration of the mucous has helped with her coughing as well.   She also shows me pictures of her pulse ox going down into the 80% range at night. She is unsure whether this is outdoors or  not. She has not correlated it to the location (inside versus outside). She says her watch does not take a reading unless "everything is perfect". She is unsure of correlating  She remains on the AirDuo and she knows that it is working. She had some trouble getting it once and she noticed that she felt worse when she did not have it. She is just wondering now whether this acid reflux is the issue and whether it is activating her asthma every day. She thinks that her reflux is "activating" her asthma every day.   She has a referral pending to a pulmonologist. She is  asking when she needs to use her rescue inhaler. She has not been using her Xopenex at all.   Otherwise, there has been no changes to her past medical history, surgical history, family history, or social history.     Review of Systems  Constitutional: Negative.  Negative for chills, fever, malaise/fatigue and weight loss.  HENT: Positive for congestion. Negative for ear discharge, ear pain and sinus pain.   Eyes: Negative for pain, discharge and redness.  Respiratory: Positive for cough and shortness of breath. Negative for sputum production and wheezing.   Cardiovascular: Negative.  Negative for chest pain and palpitations.  Gastrointestinal: Negative for abdominal pain, constipation, diarrhea, heartburn, nausea and vomiting.  Skin: Negative.  Negative for itching and rash.  Neurological: Negative for dizziness and headaches.  Endo/Heme/Allergies: Positive for environmental allergies. Does not bruise/bleed easily.       Objective:   Blood pressure 112/80, pulse 86, temperature 97.8 F (36.6 C), temperature source Temporal, resp. rate 16, height 5\' 2"  (1.575 m), weight 217 lb 3.2 oz (98.5 kg), SpO2 97 %. Body mass index is 39.73 kg/m.   Physical Exam:   Physical Exam Constitutional:      Appearance: She is well-developed.     Comments: Seems more chill than previous visits.   HENT:     Head: Normocephalic and atraumatic.     Right Ear: Tympanic membrane, ear canal and external ear normal.     Left Ear: Tympanic membrane and ear canal normal.     Nose: No nasal deformity, septal deviation, mucosal edema or rhinorrhea.     Right Sinus: No maxillary sinus tenderness or frontal sinus tenderness.     Left Sinus: No maxillary sinus tenderness or frontal sinus tenderness.     Mouth/Throat:     Mouth: Mucous membranes are not pale and not dry.     Pharynx: Uvula midline.  Eyes:     General: Allergic shiner present.        Right eye: No discharge.        Left eye: No discharge.      Conjunctiva/sclera: Conjunctivae normal.     Right eye: Right conjunctiva is not injected. No chemosis.    Left eye: Left conjunctiva is not injected. No chemosis.    Pupils: Pupils are equal, round, and reactive to light.  Cardiovascular:     Rate and Rhythm: Normal rate and regular rhythm.     Heart sounds: Normal heart sounds.  Pulmonary:     Effort: Pulmonary effort is normal. No tachypnea, accessory muscle usage or respiratory distress.     Breath sounds: Normal breath sounds. No wheezing, rhonchi or rales.     Comments: Moving air well in all lung fields. No increased work of breathing noted.  Chest:     Chest wall: No tenderness.  Lymphadenopathy:     Cervical: No  cervical adenopathy.  Skin:    Coloration: Skin is not pale.     Findings: No abrasion, erythema, petechiae or rash. Rash is not papular, urticarial or vesicular.  Neurological:     Mental Status: She is alert.  Psychiatric:        Behavior: Behavior is cooperative.      Diagnostic studies: none    Malachi Bonds, MD [facility]

## 2020-10-23 ENCOUNTER — Encounter: Payer: Self-pay | Admitting: Allergy & Immunology

## 2020-10-28 ENCOUNTER — Ambulatory Visit: Payer: BC Managed Care – PPO | Admitting: Internal Medicine

## 2020-10-28 ENCOUNTER — Encounter: Payer: Self-pay | Admitting: Internal Medicine

## 2020-10-28 ENCOUNTER — Other Ambulatory Visit: Payer: Self-pay

## 2020-10-28 VITALS — BP 118/74 | HR 91 | Temp 98.2°F | Ht 64.0 in | Wt 217.8 lb

## 2020-10-28 DIAGNOSIS — J4530 Mild persistent asthma, uncomplicated: Secondary | ICD-10-CM | POA: Diagnosis not present

## 2020-10-28 DIAGNOSIS — R053 Chronic cough: Secondary | ICD-10-CM

## 2020-10-28 DIAGNOSIS — K219 Gastro-esophageal reflux disease without esophagitis: Secondary | ICD-10-CM

## 2020-10-28 NOTE — Progress Notes (Signed)
       Susan Jefferson    [id number]    11/06/1971  Primary Care Physician:Polite, Ronald, MD  Referring Physician: Long, Scott, PA-C [address] [place],  [place] [id number] Reason for Consultation: productive cough Date of Consultation: 10/28/2020  Chief complaint:   Chief Complaint  Patient presents with  . Consult    productive cough x years, recent asthma dx      HPI: Susan Jefferson is a 49 y.o. unvaccinated to covid 19 woman who presents for new patient evaluation. History of childhood seasonal allergies.   Chronic cough of 5 years. She has a raspy voice. She has frequent throat clearing. Has seen GI and was started on PPI and this has helped her cough. She also coughs when she eats.   She is on airduo and feels this helps her. She can tell a difference when she misses doses. She thinks xopanex helps her when she takes it - was recently instructed to take three times a day.    Zyrtec doesn't help much. Benadryl helps her cough. She says benadryl helped things "feel relaxed." stopped working after a few days. Has had allergy testing with two allergists and ordered a home kit from the internet and nothing was positive. Home kit said she had a meat allergy.    Current Regimen: She is on airduo and xopanex Asthma Triggers: smoke, cardboard boxes, eating Exacerbations in the last year: never needed prednisone. History of hospitalization or intubation: none Hives: none Allergy Testing: yes seen allergy - seasonal allergies to trees/grasses.   GERD: yes on omeprazole Allergic Rhinitis: yes on zyrtec/benadryl ACT:  Asthma Control Test ACT Total Score  07/01/2020 10   FeNO:    Social history:  Occupation: works as a teacher Exposures: lives at home independently with her dogs.  Smoking history: never smoker, no passive smoke exposure  Social History   Occupational History  . Not on file  Tobacco Use  . Smoking status: Never Smoker  . Smokeless tobacco:  Never Used  Vaping Use  . Vaping Use: Never used  Substance and Sexual Activity  . Alcohol use: Never  . Drug use: Never  . Sexual activity: Not on file    Relevant family history:  Family History  Problem Relation Age of Onset  . Allergic rhinitis Son   . Breast cancer Mother 60  . Asthma Neg Hx   . Eczema Neg Hx   . Urticaria Neg Hx     Past Medical History:  Diagnosis Date  . Benign focal childhood epilepsy (HCC)    no meds since age 27  . Deviated nasal septum   . Family history of adverse reaction to anesthesia    Pt's mom had bad reaction to Succinylcholine. Does not sound like a MH reaction- just difficult to awaken and the MD told her to avoid going forward.  . Moderate persistent asthma, uncomplicated 07/01/2020    Past Surgical History:  Procedure Laterality Date  . ETHMOIDECTOMY Bilateral 06/21/2019   Procedure: BILATERAL TOTAL ETHMOIDECTOMY AND SPHENOIDECTOMY;  Surgeon: Teoh, Su, MD;  Location: [facility];  Service: ENT;  Laterality: Bilateral;  . MAXILLARY ANTROSTOMY Bilateral 06/21/2019   Procedure: BILATERAL MAXILLARY ANTROSTOMY WITH TISSUE REMOVAL;  Surgeon: Teoh, Su, MD;  Location:  SURGERY CENTER;  Service: ENT;  Laterality: Bilateral;  . NASAL SEPTOPLASTY W/ TURBINOPLASTY    . NASAL SEPTOPLASTY W/ TURBINOPLASTY Bilateral 06/21/2019   Procedure: NASAL SEPTOPLASTY WITH TURBINATE REDUCTION;  Surgeon: Teoh,   Su, MD;  Location: [facility];  Service: ENT;  Laterality: Bilateral;  . SINUS ENDO WITH FUSION Bilateral 06/21/2019   Procedure: SINUS ENDO WITH FUSION;  Surgeon: Leta Baptist, MD;  Location: [facility];  Service: ENT;  Laterality: Bilateral;  . WISDOM TOOTH EXTRACTION      Physical Exam: Blood pressure 118/74, pulse 91, temperature 98.2 F (36.8 C), height 5' 4" (1.626 m), weight 217 lb 12.8 oz (98.8 kg), SpO2 98 %. Gen:      No acute distress, raspy voice ENT:  +cobblestoning in oropharynx, no nasal  polyps, mucus membranes moist Lungs:    No increased respiratory effort, symmetric chest wall excursion, clear to auscultation bilaterally, no wheezes or crackles CV:         Regular rate and rhythm; no murmurs, rubs, or gallops.  No pedal edema Abd:      + bowel sounds; soft, non-tender; no distension MSK: no acute synovitis of DIP or PIP joints, no mechanics hands.  Skin:      Warm and dry; no rashes Neuro: normal speech, no focal facial asymmetry Psych: alert and oriented x3, normal mood and affect, anxious   Data Reviewed/Medical Decision Making:  Independent interpretation of tests: Imaging: . Review of patient's chest xray Jan 2020 images revealed no acute progress. . The patient's images have been independently reviewed by me.    PFTs: Spirometry reviewed from July 2021 from allergy - normal.   Labs:  Extensive allergy testing RAST negative. No IgE elevation. No eosinophilia.   Immunization status:   There is no immunization history on file for this patient.  . I reviewed prior external note(s) from urgent care.  . I reviewed the result(s) of the labs and imaging as noted above.  . I have ordered PFT  Assessment:  Chronic Cough multifactorial to below - Moderate persistent asthma Non-seasonal allergic rhinitis LPR with irritable larynx Muscle spasm - right hemithorax  Plan/Recommendations: I suspect the majority of her symptoms are worse currently due to LPR which is in turn making asthma worse. Agree with BID PPI. I discussed lifestyle modification and dietary modifications extensively as well.  Keep air duo and xopanex as needed for asthma. Discussed nasal sprays as well but she feels they don't help her. Low dose ibuprofen and warm compresses for muscle strain. Provided reassurance that she is doing all the right things. She is interested in losing weight and is frustrated she can't exercise. I advised her to focus on diet first and the exercise will come as her  symptoms come under better control. If no improvement I'd advise her to see laryngologist and undergo SLP.   I offered her additional breathing testing including spirometry and FeNO but at this point would have her follow up as needed should her symptoms not improve with her current specialists.    Return to Care: Return if symptoms worsen or fail to improve.  Lenice Llamas, MD [facility] [place]  CC: Long, [place], [place]

## 2020-10-28 NOTE — Patient Instructions (Addendum)
Keep taking acid reflux medicine twice a day.  Keep air duo and xopanex as needed.  Try a wedge pillow for reflux.  Try warm compresses and low dose anti-inflammatory medications like ibuprofen for muscle pain.    What is GERD? Gastroesophageal reflux disease (GERD) is gastroesophageal reflux diseasewhich occurs when the lower esophageal sphincter (LES) opens spontaneously, for varying periods of time, or does not close properly and stomach contents rise up into the esophagus. GER is also called acid reflux or acid regurgitation, because digestive juices--called acids--rise up with the food. The esophagus is the tube that carries food from the mouth to the stomach. The LES is a ring of muscle at the bottom of the esophagus that acts like a valve between the esophagus and stomach.  When acid reflux occurs, food or fluid can be tasted in the back of the mouth. When refluxed stomach acid touches the lining of the esophagus it may cause a burning sensation in the chest or throat called heartburn or acid indigestion. Occasional reflux is common. Persistent reflux that occurs more than twice a week is considered GERD, and it can eventually lead to more serious health problems. People of all ages can have GERD. Studies have shown that GERD may worsen or contribute to asthma, chronic cough, and pulmonary fibrosis.   What are the symptoms of GERD? The main symptom of GERD in adults is frequent heartburn, also called acid indigestion--burning-type pain in the lower part of the mid-chest, behind the breast bone, and in the mid-abdomen.  Not all reflux is acidic in nature, and many patients don't have heart burn at all. Sometimes it feels like a cough (either dry or with mucus), choking sensation, asthma, shortness of breath, waking up at night, frequent throat clearing, or trouble swallowing.    What causes GERD? The reason some people develop GERD is still unclear. However, research shows that in people  with GERD, the LES relaxes while the rest of the esophagus is working. Anatomical abnormalities such as a hiatal hernia may also contribute to GERD. A hiatal hernia occurs when the upper part of the stomach and the LES move above the diaphragm, the muscle wall that separates the stomach from the chest. Normally, the diaphragm helps the LES keep acid from rising up into the esophagus. When a hiatal hernia is present, acid reflux can occur more easily. A hiatal hernia can occur in people of any age and is most often a normal finding in otherwise healthy people over age 5. Most of the time, a hiatal hernia produces no symptoms.   Other factors that may contribute to GERD include - Obesity or recent weight gain - Pregnancy  - Smoking  - Diet - Certain medications  Common foods that can worsen reflux symptoms include: - carbonated beverages - artificial sweeteners - citrus fruits  - chocolate  - drinks with caffeine or alcohol  - fatty and fried foods  - garlic and onions  - mint flavorings  - spicy foods  - tomato-based foods, like spaghetti sauce, salsa, chili, and pizza   Lifestyle Changes If you smoke, stop.  Avoid foods and beverages that worsen symptoms (see above.) Lose weight if needed.  Eat small, frequent meals.  Wear loose-fitting clothes.  Avoid lying down for 3 hours after a meal.  Raise the head of your bed 6 to 8 inches by securing wood blocks under the bedposts. Just using extra pillows will not help, but using a wedge-shaped pillow may be helpful.  Medications  H2 blockers, such as cimetidine (Tagamet HB), famotidine (Pepcid AC), nizatidine (Axid AR), and ranitidine (Zantac 75), decrease acid production. They are available in prescription strength and over-the-counter strength. These drugs provide short-term relief and are effective for about half of those who have GERD symptoms.  Proton pump inhibitors include omeprazole (Prilosec, Zegerid), lansoprazole (Prevacid),  pantoprazole (Protonix), rabeprazole (Aciphex), and esomeprazole (Nexium), which are available by prescription. Prilosec is also available in over-the-counter strength. Proton pump inhibitors are more effective than H2 blockers and can relieve symptoms and heal the esophageal lining in almost everyone who has GERD.  Because drugs work in different ways, combinations of medications may help control symptoms. People who get heartburn after eating may take both antacids and H2 blockers. The antacids work first to neutralize the acid in the stomach, and then the H2 blockers act on acid production. By the time the antacid stops working, the H2 blocker will have stopped acid production. Your health care provider is the best source of information about how to use medications for GERD.   Points to Remember 1. You can have GERD without having heartburn. Your symptoms could include a dry cough, asthma symptoms, or trouble swallowing.  2. Taking medications daily as prescribed is important in controlling you symptoms.  Sometimes it can take up to 8 weeks to fully achieve the effects of the medications prescribed.  3. Coughing related to GERD can be difficult to treat and is very frustrating!  However, it is important to stick with these medications and lifestyle modifications before pursuing more aggressive or invasive test and treatments.

## 2020-12-02 ENCOUNTER — Ambulatory Visit: Payer: BC Managed Care – PPO | Admitting: Allergy & Immunology

## 2020-12-02 ENCOUNTER — Other Ambulatory Visit: Payer: Self-pay

## 2020-12-02 ENCOUNTER — Encounter: Payer: Self-pay | Admitting: Allergy & Immunology

## 2020-12-02 VITALS — BP 130/70 | HR 75 | Temp 98.0°F | Resp 17 | Ht 64.5 in | Wt 217.2 lb

## 2020-12-02 DIAGNOSIS — K219 Gastro-esophageal reflux disease without esophagitis: Secondary | ICD-10-CM

## 2020-12-02 DIAGNOSIS — J302 Other seasonal allergic rhinitis: Secondary | ICD-10-CM

## 2020-12-02 DIAGNOSIS — J4540 Moderate persistent asthma, uncomplicated: Secondary | ICD-10-CM | POA: Diagnosis not present

## 2020-12-02 DIAGNOSIS — Z7185 Encounter for immunization safety counseling: Secondary | ICD-10-CM | POA: Diagnosis not present

## 2020-12-02 DIAGNOSIS — J3089 Other allergic rhinitis: Secondary | ICD-10-CM | POA: Diagnosis not present

## 2020-12-02 DIAGNOSIS — R490 Dysphonia: Secondary | ICD-10-CM | POA: Diagnosis not present

## 2020-12-02 MED ORDER — LEVALBUTEROL TARTRATE 45 MCG/ACT IN AERO: 2.0000 | INHALATION_SPRAY | Freq: Four times a day (QID) | RESPIRATORY_TRACT | 1 refills | Status: DC | PRN

## 2020-12-02 MED ORDER — AIRDUO DIGIHALER 113-14 MCG/ACT IN AEPB: 1.0000 | INHALATION_SPRAY | Freq: Two times a day (BID) | RESPIRATORY_TRACT | 5 refills | Status: DC

## 2020-12-02 MED ORDER — PANTOPRAZOLE SODIUM 40 MG PO TBEC: 40.0000 mg | DELAYED_RELEASE_TABLET | Freq: Two times a day (BID) | ORAL | 2 refills | Status: DC

## 2020-12-02 NOTE — Progress Notes (Signed)
FOLLOW UP  Date of Service/Encounter:  12/02/20   Assessment:   Moderate persistent asthma, uncomplicated - with Pulmonology referral pending for evaluation of chronic cough  Seasonal and perennial allergic rhinitis(grasses, weeds, trees, outdoor molds, dust mites)   Adverse food reactions - ? reactive airway dysfunction syndrome versus VCD  COVID19 vaccine hesitancy  Plan/Recommendations:   1. Moderate persistent allergic asthma, uncomplicated - Lung testing deferred today.  - We are going to continue with AirDuo one puff twice daily (copay card provided today).  - Stop the Xopenex three times daily and use only as needed.  - Stop all of the essential oils for three months to give your lungs time to heal. - Then introduce them one by one and dilute them more.  - We are offering COVID vaccine clinics at our office - ask on your way out!   2. Perennial and seasonal allergic rhinitis (grasses, weeds, trees, outdoor molds, dust mites) - Continue with Benadryl as needed.  - Continue with cetirizine daily.   3. Possible GERD  - Continue with Protonix (pantoprazole) daily.   - Continue with Pepcid (famotidine) once daily.   4. Return in about 4 months (around 04/02/2021).   Subjective:   Susan Jefferson is a 49 y.o. female presenting today for follow up of  Chief Complaint  Patient presents with  . Asthma    Susan Jefferson has a history of the following: Patient Active Problem List   Diagnosis Date Noted  . Moderate persistent asthma, uncomplicated 07/01/2020  . Seasonal and perennial allergic rhinitis 07/01/2020  . Acute maxillary sinusitis 01/01/2019  . Eustachian tube dysfunction, bilateral 11/02/2018  . Coughing 11/01/2018  . Non-allergic rhinitis 11/01/2018    History obtained from: chart review and patient.  Susan Jefferson is a 49 y.o. female presenting for a follow up visit.  She was last seen in November 2021.  At that time, we deferred lung testing.  We  continued with AirDuo, but increased to the higher dose. We also continue with Xopenex as needed.  She was having hypoxia, as determined by her apple watch.  I recommended getting a pulse ox to see if this correlates.  For her allergic rhinitis, we continue with Zyrtec and Benadryl.  In the interim, she did go to see Dr. Jackalyn Lombard with pulmonology.  At that time, she agreed with the twice daily PPI treatment.  She felt that a lot of her symptoms are related to laryngopharyngeal reflux. She was continued on AirDuo and Xopenex for treatment of asthma.   Since last visit, she has done fairly well.  Asthma/Respiratory Symptom History: She reports that she is doing so much better. She was with her mother over Christmas, who noticed that she is doing better. She is starting to see that she has both reflux and asthma.  Ambika's asthma has been well controlled. She has not required rescue medication, experienced nocturnal awakenings due to lower respiratory symptoms, nor have activities of daily living been limited. She has required no Emergency Department or Urgent Care visits for her asthma. She has required zero courses of systemic steroids for asthma exacerbations since the last visit. ACT score today is 14, indicating terrible asthma symptom control. But this is mostly secondary to her use of Xopenex 3 times daily, which she has been doing since last visit.  She did go to get a finger pulse ox which has been consistently around 95-96%. The watch was very insconsistent.  She will interpose attention to it for  pulse ox.  She does have a lot of questions regarding possible allergies to essential oils.  She is wondering what is irritant versus a true allergy.  We did spend some time discussing the differences of each.  Allergic Rhinitis Symptom History: She remains on cetirizine 10 mg daily.  She has not needed antibiotics at all.  She denies sinus pressure.  GERD Symptom History: She remains on Protonix  twice daily and Pepcid once daily.  She does not see gastroenterology.  Overall, this medication regimen seems to control her symptoms.  She has not received the COVID-19 vaccine.  This seems to stem from the concern that she is going to be injected with some kind of chemical that will affect her in 10 to 20 years.  She is really not aware of how the vaccine works, but seems very open to the vaccine after we discussed her concerns.  Otherwise, there have been no changes to her past medical history, surgical history, family history, or social history.    Review of Systems  Constitutional: Negative.  Negative for chills, fever, malaise/fatigue and weight loss.  HENT: Negative for congestion, ear discharge, ear pain and sinus pain.   Eyes: Negative for pain, discharge and redness.  Respiratory: Negative for cough, sputum production, shortness of breath and wheezing.   Cardiovascular: Negative.  Negative for chest pain and palpitations.  Gastrointestinal: Negative for abdominal pain, constipation, diarrhea, heartburn, nausea and vomiting.  Skin: Negative.  Negative for itching and rash.  Neurological: Negative for dizziness and headaches.  Endo/Heme/Allergies: Negative for environmental allergies. Does not bruise/bleed easily.       Objective:   Blood pressure 130/70, pulse 75, temperature 98 F (36.7 C), temperature source Temporal, resp. rate 17, height 5' 4.5" (1.638 m), weight 217 lb 3.2 oz (98.5 kg), SpO2 97 %. Body mass index is 36.71 kg/m.   Physical Exam:  Physical Exam Constitutional:      Appearance: She is well-developed.     Comments: Pleasant interactive female.  HENT:     Head: Normocephalic and atraumatic.     Right Ear: Tympanic membrane, ear canal and external ear normal.     Left Ear: Tympanic membrane, ear canal and external ear normal.     Nose: No nasal deformity, septal deviation, mucosal edema, rhinorrhea or epistaxis.     Right Turbinates: Enlarged. Not  swollen.     Left Turbinates: Enlarged. Not swollen.     Right Sinus: No maxillary sinus tenderness or frontal sinus tenderness.     Left Sinus: No maxillary sinus tenderness or frontal sinus tenderness.     Mouth/Throat:     Mouth: Oropharynx is clear and moist. Mucous membranes are not pale and not dry.     Pharynx: Uvula midline.  Eyes:     General:        Right eye: No discharge.        Left eye: No discharge.     Extraocular Movements: EOM normal.     Conjunctiva/sclera: Conjunctivae normal.     Right eye: Right conjunctiva is not injected. No chemosis.    Left eye: Left conjunctiva is not injected. No chemosis.    Pupils: Pupils are equal, round, and reactive to light.  Cardiovascular:     Rate and Rhythm: Normal rate and regular rhythm.     Heart sounds: Normal heart sounds.  Pulmonary:     Effort: Pulmonary effort is normal. No tachypnea, accessory muscle usage or respiratory distress.  Breath sounds: Normal breath sounds. No wheezing, rhonchi or rales.     Comments: Moving air well in all lung fields.  No increased work of breathing. Chest:     Chest wall: No tenderness.  Lymphadenopathy:     Cervical: No cervical adenopathy.  Skin:    Coloration: Skin is not pale.     Findings: No abrasion, erythema, petechiae or rash. Rash is not papular, urticarial or vesicular.     Comments: No eczematous or urticarial lesions noted.  Neurological:     Mental Status: She is alert.  Psychiatric:        Mood and Affect: Mood and affect normal.       Diagnostic studies: none      Malachi Bonds, MD  [facility]

## 2020-12-02 NOTE — Patient Instructions (Addendum)
1. Moderate persistent allergic asthma, uncomplicated - Lung testing deferred today.  - We are going to continue with AirDuo one puff twice daily (copay card provided today).  - Stop the Xopenex three times daily and use only as needed.  - Stop all of the essential oils for three months to give your lungs time to heal. - Then introduce them one by one and dilute them more.  - We are offering COVID vaccine clinics at our office - ask on your way out!   2. Perennial and seasonal allergic rhinitis (grasses, weeds, trees, outdoor molds, dust mites) - Continue with Benadryl as needed.  - Continue with cetirizine daily.   3. Possible GERD  - Continue with Protonix (pantoprazole) daily.   - Continue with Pepcid (famotidine) once daily.   4. Return in about 4 months (around 04/02/2021).   Please inform us of any Emergency Department visits, hospitalizations, or changes in symptoms. Call us before going to the ED for breathing or allergy symptoms since we might be able to fit you in for a sick visit. Feel free to contact us anytime with any questions, problems, or concerns.  It was a pleasure to see you again today!  Websites that have reliable patient information: 1. American Academy of Asthma, Allergy, and Immunology: www.aaaai.org 2. Food Allergy Research and Education (FARE): foodallergy.org 3. Mothers of Asthmatics: http://www.asthmacommunitynetwork.org 4. American College of Allergy, Asthma, and Immunology: www.acaai.org   COVID-19 Vaccine Information can be found at: [email or web address].[email or web address] For questions related to vaccine distribution or appointments, please email [email or web address] or call [phone].     Like [place] on [employer] and Instagram for our latest updates!       Make sure you are registered to vote! If you have moved or changed any of your contact information, you will need to get this updated before  voting!  In some cases, you MAY be able to register to vote online: [email or web address]

## 2021-01-05 ENCOUNTER — Other Ambulatory Visit: Payer: Self-pay

## 2021-01-05 ENCOUNTER — Ambulatory Visit (INDEPENDENT_AMBULATORY_CARE_PROVIDER_SITE_OTHER): Payer: BC Managed Care – PPO

## 2021-01-05 DIAGNOSIS — Z23 Encounter for immunization: Secondary | ICD-10-CM

## 2021-01-05 NOTE — Progress Notes (Signed)
   Covid-19 Vaccination Clinic  Name:  Susan Jefferson    MRN: [id number] DOB: 1971-05-27  01/05/2021  [name] was observed post Covid-19 immunization for 15 minutes without incident. She was provided with Vaccine Information Sheet and instruction to access the V-Safe system.   [name] was instructed to call 911 with any severe reactions post vaccine: Marland Kitchen Difficulty breathing  . Swelling of face and throat  . A fast heartbeat  . A bad rash all over body  . Dizziness and weakness   Immunizations Administered    Name Date Dose VIS Date Route   Pfizer COVID-19 Vaccine 01/05/2021  3:19 PM 0.3 mL 09/24/2020 Intramuscular   Manufacturer: [employer], [employer]   Lot: [id number]   NDC: [address]-0

## 2021-01-26 ENCOUNTER — Ambulatory Visit (INDEPENDENT_AMBULATORY_CARE_PROVIDER_SITE_OTHER): Payer: BC Managed Care – PPO

## 2021-01-26 ENCOUNTER — Other Ambulatory Visit: Payer: Self-pay

## 2021-01-26 DIAGNOSIS — Z23 Encounter for immunization: Secondary | ICD-10-CM

## 2021-01-26 NOTE — Progress Notes (Signed)
   Covid-19 Vaccination Clinic  Name:  Shanece Cochrane    MRN: [id number] DOB: [birth year]  01/26/2021  Ms. Fitzsimmons was observed post Covid-19 immunization for 15 minutes without incident. She was provided with Vaccine Information Sheet and instruction to access the V-Safe system.   Ms. Loy was instructed to call 911 with any severe reactions post vaccine: Marland Kitchen Difficulty breathing  . Swelling of face and throat  . A fast heartbeat  . A bad rash all over body  . Dizziness and weakness   Immunizations Administered    Name Date Dose VIS Date Route   PFIZER Comrnaty(Gray TOP) Covid-19 Vaccine 01/26/2021  3:19 PM 0.3 mL 11/13/2020 Intramuscular   Manufacturer: [employer], [employer]   Lot: [id number]   NDC: [phone]

## 2021-01-27 ENCOUNTER — Other Ambulatory Visit: Payer: Self-pay

## 2021-01-27 ENCOUNTER — Telehealth: Payer: Self-pay

## 2021-01-27 MED ORDER — BUDESONIDE-FORMOTEROL FUMARATE 160-4.5 MCG/ACT IN AERO: 2.0000 | INHALATION_SPRAY | Freq: Two times a day (BID) | RESPIRATORY_TRACT | 5 refills | Status: DC

## 2021-01-27 NOTE — Telephone Encounter (Signed)
Let's do Symbicort 160/4.34mcg two puffs BID.  Malachi Bonds, MD [facility]

## 2021-01-27 NOTE — Telephone Encounter (Signed)
pts insurance will not cover airduo until advair, symbicort or flovent hfa are failed. pelase advise

## 2021-01-27 NOTE — Telephone Encounter (Signed)
Sent in med and left message for pt explaining the change

## 2021-04-07 ENCOUNTER — Ambulatory Visit (INDEPENDENT_AMBULATORY_CARE_PROVIDER_SITE_OTHER): Payer: BC Managed Care – PPO | Admitting: Allergy & Immunology

## 2021-04-07 ENCOUNTER — Other Ambulatory Visit: Payer: Self-pay

## 2021-04-07 ENCOUNTER — Encounter: Payer: Self-pay | Admitting: Allergy & Immunology

## 2021-04-07 VITALS — BP 124/72 | HR 102 | Temp 97.5°F | Resp 16 | Ht 64.0 in | Wt 228.4 lb

## 2021-04-07 DIAGNOSIS — R490 Dysphonia: Secondary | ICD-10-CM

## 2021-04-07 DIAGNOSIS — K219 Gastro-esophageal reflux disease without esophagitis: Secondary | ICD-10-CM | POA: Diagnosis not present

## 2021-04-07 DIAGNOSIS — J302 Other seasonal allergic rhinitis: Secondary | ICD-10-CM

## 2021-04-07 DIAGNOSIS — J4540 Moderate persistent asthma, uncomplicated: Secondary | ICD-10-CM

## 2021-04-07 DIAGNOSIS — J3089 Other allergic rhinitis: Secondary | ICD-10-CM | POA: Diagnosis not present

## 2021-04-07 MED ORDER — AIRDUO DIGIHALER 113-14 MCG/ACT IN AEPB: 1.0000 | INHALATION_SPRAY | Freq: Two times a day (BID) | RESPIRATORY_TRACT | 2 refills | Status: AC

## 2021-04-07 MED ORDER — FAMOTIDINE 40 MG PO TABS: 40.0000 mg | ORAL_TABLET | Freq: Every day | ORAL | 1 refills | Status: AC

## 2021-04-07 MED ORDER — PANTOPRAZOLE SODIUM 40 MG PO TBEC: 40.0000 mg | DELAYED_RELEASE_TABLET | Freq: Every day | ORAL | 1 refills | Status: AC

## 2021-04-07 NOTE — Progress Notes (Signed)
FOLLOW UP  Date of Service/Encounter:  04/07/21   Assessment:   Moderate persistent asthma, uncomplicated- evaluated by Pulmonology but following up with them as needed  Seasonal and perennial allergic rhinitis(grasses, weeds, trees, outdoor molds, dust mites)   Adverse food reactions - ? reactive airway dysfunction syndrome versus VCD  COVID19 vaccine hesitancy  Plan/Recommendations:   1. Moderate persistent allergic asthma, uncomplicated - Lung testing looked perfect today.  - Since you have done better with the AirDuo, we will send that in since you "failed" Symbicort. - Continue with the as needed use of the Xopenex for emergencies.  - Lose weight and see if that helps at all.   2. Perennial and seasonal allergic rhinitis (grasses, weeds, trees, outdoor molds, dust mites) - Continue with Benadryl as needed.  - Continue with cetirizine daily.  - Information on allergen immunotherapy provided.   3. Possible GERD  - Continue with Protonix (pantoprazole) daily.   - Continue with Pepcid (famotidine) once daily.   4. Return in about 6 months (around 10/08/2021).   Subjective:   Susan Jefferson is a 50 y.o. female presenting today for follow up of  Chief Complaint  Patient presents with  . Asthma  . Allergic Rhinitis     Last couple weeks - coughing, sneezing, congestion     Susan Jefferson has a history of the following: Patient Active Problem List   Diagnosis Date Noted  . Gastroesophageal reflux disease 12/02/2020  . Moderate persistent asthma, uncomplicated 07/01/2020  . Seasonal and perennial allergic rhinitis 07/01/2020  . Acute maxillary sinusitis 01/01/2019  . Eustachian tube dysfunction, bilateral 11/02/2018  . Coughing 11/01/2018  . Non-allergic rhinitis 11/01/2018    History obtained from: chart review and patient.  Susan Jefferson is a 50 y.o. female presenting for a follow up visit.  She has a history of shortness of breath which we have been  unable to really confirm an etiology.  She also has coughing.  We have been treating this with asthma medicines including combined ICS/LABAs.  We stopped her Xopenex and recommended stopping her essential oils.  For her allergic rhinitis, would continue with Benadryl as well as cetirizine.  For her reflux we continue with Protonix as well as Pepcid.  Since the last visit, she has mostly done well. She is hacking quite a bit today but she tells me this is from doing the spirometry.   Asthma/Respiratory Symptom History: She is on Symbicort because her insurance did not cover the AirDuo.  She feels that the Symbicort does not work as well as the previous inhaler.  She does have her Xopenex to use which she has been using less frequently.  It seems that compared to the last visit, she has now gone the other direction then has not been using the Xopenex when she needs to.  Prior to this, she was using her Xopenex too much and more scheduled because she felt that this was what she was supposed to do.  Regardless, coughing episodes have certainly improved.  She thinks we are on the right track especially with regards to treatment of the GERD. She has been working on losing weight.  She reports that she gained a lot of weight after her divorce 5 years ago.  A lot of the symptoms seem to have been triggered by weight gain.  She has monitor about the connection between weight and asthma.  Allergic Rhinitis Symptom History: She is on the cetirizine daily.  She does continue to have some  postnasal drip.  She has not needed antibiotics.  Overall, symptoms are well controlled. She is wondering about allergy shots.  GERD Symptom History: She remains on the pantoprazole as well as the famotidine.  Otherwise, there have been no changes to her past medical history, surgical history, family history, or social history.    Review of Systems  Constitutional: Negative.  Negative for chills, fever, malaise/fatigue and weight  loss.  HENT: Positive for congestion. Negative for ear discharge, ear pain and sinus pain.   Eyes: Negative for pain, discharge and redness.  Respiratory: Negative for cough, sputum production, shortness of breath and wheezing.   Cardiovascular: Negative.  Negative for chest pain and palpitations.  Gastrointestinal: Negative for abdominal pain, constipation, diarrhea, heartburn, nausea and vomiting.  Skin: Negative.  Negative for itching and rash.  Neurological: Negative for dizziness and headaches.  Endo/Heme/Allergies: Positive for environmental allergies. Does not bruise/bleed easily.       Objective:   Blood pressure 124/72, pulse (!) 102, temperature (!) 97.5 F (36.4 C), resp. rate 16, height 5\' 4"  (1.626 m), weight 228 lb 6.4 oz (103.6 kg), SpO2 95 %. Body mass index is 39.2 kg/m.   Physical Exam:  Physical Exam Constitutional:      Appearance: She is well-developed.  HENT:     Head: Normocephalic and atraumatic.     Right Ear: Tympanic membrane, ear canal and external ear normal.     Left Ear: Tympanic membrane, ear canal and external ear normal.     Nose: No nasal deformity, septal deviation, mucosal edema or rhinorrhea.     Right Turbinates: Enlarged and swollen.     Left Turbinates: Enlarged and swollen.     Right Sinus: No maxillary sinus tenderness or frontal sinus tenderness.     Left Sinus: No maxillary sinus tenderness or frontal sinus tenderness.     Mouth/Throat:     Mouth: Mucous membranes are not pale and not dry.     Pharynx: Uvula midline.  Eyes:     General: Gaze aligned appropriately. Allergic shiner present.        Right eye: No discharge.        Left eye: No discharge.     Conjunctiva/sclera: Conjunctivae normal.     Right eye: Right conjunctiva is not injected. No chemosis.    Left eye: Left conjunctiva is not injected. No chemosis.    Pupils: Pupils are equal, round, and reactive to light.  Cardiovascular:     Rate and Rhythm: Normal rate and  regular rhythm.     Heart sounds: Normal heart sounds.  Pulmonary:     Effort: Pulmonary effort is normal. No tachypnea, accessory muscle usage or respiratory distress.     Breath sounds: Normal breath sounds. No wheezing, rhonchi or rales.     Comments: Coughing quite a bit.  Dry hacking cough. Chest:     Chest wall: No tenderness.  Lymphadenopathy:     Cervical: No cervical adenopathy.  Skin:    General: Skin is cool.     Capillary Refill: Capillary refill takes less than 2 seconds.     Coloration: Skin is not pale.     Findings: No abrasion, erythema, petechiae or rash. Rash is not papular, urticarial or vesicular.     Comments: No eczematous or urticarial lesions noted.   Neurological:     Mental Status: She is alert.  Psychiatric:        Behavior: Behavior is cooperative.      Diagnostic  studies:    Spirometry: results normal (FEV1: 2.09/71%, FVC: 3.05/83%, FEV1/FVC: 69%).    Spirometry consistent with normal pattern.   Allergy Studies: none       Malachi Bonds, MD  [facility]

## 2021-04-07 NOTE — Patient Instructions (Addendum)
1. Moderate persistent allergic asthma, uncomplicated - Lung testing looked perfect today.  - Since you have done better with the AirDuo, we will send that in since you "failed" Symbicort. - Continue with the as needed use of the Xopenex for emergencies.  - Lose weight and see if that helps at all.   2. Perennial and seasonal allergic rhinitis (grasses, weeds, trees, outdoor molds, dust mites) - Continue with Benadryl as needed.  - Continue with cetirizine daily.  - Information on allergen immunotherapy provided.   3. Possible GERD  - Continue with Protonix (pantoprazole) daily.   - Continue with Pepcid (famotidine) once daily.   4. Return in about 6 months (around 10/08/2021).    Please inform us of any Emergency Department visits, hospitalizations, or changes in symptoms. Call us before going to the ED for breathing or allergy symptoms since we might be able to fit you in for a sick visit. Feel free to contact us anytime with any questions, problems, or concerns.  It was a pleasure to see you again today!  Websites that have reliable patient information: 1. American Academy of Asthma, Allergy, and Immunology: www.aaaai.org 2. Food Allergy Research and Education (FARE): foodallergy.org 3. Mothers of Asthmatics: http://www.asthmacommunitynetwork.org 4. American College of Allergy, Asthma, and Immunology: www.acaai.org   COVID-19 Vaccine Information can be found at: [email or web address] For questions related to vaccine distribution or appointments, please email [email or web address] or call [phone].   We realize that you might be concerned about having an allergic reaction to the COVID19 vaccines. To help with that concern, WE ARE OFFERING THE COVID19 VACCINES IN OUR OFFICE! Ask the front desk for dates!     "Like" [place] on Facebook and Instagram for our latest updates!      A healthy democracy works best when [employer]  participate! Make sure you are registered to vote! If you have moved or changed any of your contact information, you will need to get this updated before voting!  In some cases, you MAY be able to register to vote online: [email or web address]    Allergy Shots   Allergies are the result of a chain reaction that starts in the immune system. Your immune system controls how your body defends itself. For instance, if you have an allergy to pollen, your immune system identifies pollen as an invader or allergen. Your immune system overreacts by producing antibodies called Immunoglobulin E (IgE). These antibodies travel to cells that release chemicals, causing an allergic reaction.  The concept behind allergy immunotherapy, whether it is received in the form of shots or tablets, is that the immune system can be desensitized to specific allergens that trigger allergy symptoms. Although it requires time and patience, the payback can be long-term relief.  How Do Allergy Shots Work?  Allergy shots work much like a vaccine. Your body responds to injected amounts of a particular allergen given in increasing doses, eventually developing a resistance and tolerance to it. Allergy shots can lead to decreased, minimal or no allergy symptoms.  There generally are two phases: build-up and maintenance. Build-up often ranges from three to six months and involves receiving injections with increasing amounts of the allergens. The shots are typically given once or twice a week, though more rapid build-up schedules are sometimes used.  The maintenance phase begins when the most effective dose is reached. This dose is different for each person, depending on how allergic you are and your response to the build-up injections. Once the maintenance dose is reached, there are  longer periods between injections, typically two to four weeks.  Occasionally doctors give cortisone-type shots that can  temporarily reduce allergy symptoms. These types of shots are different and should not be confused with allergy immunotherapy shots.  Who Can Be Treated with Allergy Shots?  Allergy shots may be a good treatment approach for people with allergic rhinitis (hay fever), allergic asthma, conjunctivitis (eye allergy) or stinging insect allergy.   Before deciding to begin allergy shots, you should consider:  . The length of allergy season and the severity of your symptoms . Whether medications and/or changes to your environment can control your symptoms . Your desire to avoid long-term medication use . Time: allergy immunotherapy requires a major time commitment . Cost: may vary depending on your insurance coverage  Allergy shots for children age 87 and older are effective and often well tolerated. They might prevent the onset of new allergen sensitivities or the progression to asthma.  Allergy shots are not started on patients who are pregnant but can be continued on patients who become pregnant while receiving them. In some patients with other medical conditions or who take certain common medications, allergy shots may be of risk. It is important to mention other medications you talk to your allergist.   When Will I Feel Better?  Some may experience decreased allergy symptoms during the build-up phase. For others, it may take as long as 12 months on the maintenance dose. If there is no improvement after a year of maintenance, your allergist will discuss other treatment options with you.  If you aren't responding to allergy shots, it may be because there is not enough dose of the allergen in your vaccine or there are missing allergens that were not identified during your allergy testing. Other reasons could be that there are high levels of the allergen in your environment or major exposure to non-allergic triggers like tobacco smoke.  What Is the Length of Treatment?  Once the maintenance dose  is reached, allergy shots are generally continued for three to five years. The decision to stop should be discussed with your allergist at that time. Some people may experience a permanent reduction of allergy symptoms. Others may relapse and a longer course of allergy shots can be considered.  What Are the Possible Reactions?  The two types of adverse reactions that can occur with allergy shots are local and systemic. Common local reactions include very mild redness and swelling at the injection site, which can happen immediately or several hours after. A systemic reaction, which is less common, affects the entire body or a particular body system. They are usually mild and typically respond quickly to medications. Signs include increased allergy symptoms such as sneezing, a stuffy nose or hives.  Rarely, a serious systemic reaction called anaphylaxis can develop. Symptoms include swelling in the throat, wheezing, a feeling of tightness in the chest, nausea or dizziness. Most serious systemic reactions develop within 30 minutes of allergy shots. This is why it is strongly recommended you wait in your doctor's office for 30 minutes after your injections. Your allergist is trained to watch for reactions, and his or her staff is trained and equipped with the proper medications to identify and treat them.  Who Should Administer Allergy Shots?  The preferred location for receiving shots is your prescribing allergist's office. Injections can sometimes be given at another facility where the physician and staff are trained to recognize and treat reactions, and have received instructions by your prescribing allergist.

## 2021-04-10 ENCOUNTER — Telehealth: Payer: Self-pay

## 2021-04-10 ENCOUNTER — Encounter: Payer: Self-pay | Admitting: Allergy & Immunology

## 2021-04-10 NOTE — Telephone Encounter (Signed)
-----   Message from Alfonse Spruce, MD sent at 04/10/2021  6:16 AM EDT ----- Can someone print off the discharge paperwork and send to the patient? I noticed some typos while closing the chart. I'm blaming my cold medicines!

## 2021-04-10 NOTE — Telephone Encounter (Signed)
After visit summary has been printed and place in mail out.

## 2021-10-08 ENCOUNTER — Ambulatory Visit: Payer: BC Managed Care – PPO | Admitting: Allergy & Immunology

## 2021-12-24 ENCOUNTER — Telehealth: Payer: Self-pay | Admitting: Allergy & Immunology

## 2021-12-24 ENCOUNTER — Telehealth: Payer: Self-pay

## 2021-12-24 ENCOUNTER — Other Ambulatory Visit: Payer: Self-pay

## 2021-12-24 MED ORDER — LEVALBUTEROL TARTRATE 45 MCG/ACT IN AERO: 2.0000 | INHALATION_SPRAY | Freq: Four times a day (QID) | RESPIRATORY_TRACT | 0 refills | Status: AC | PRN

## 2021-12-24 NOTE — Telephone Encounter (Signed)
Patient called and said that she lost her inhaler and needs to have Lev albuterol inhaler called into [facility] on [address] [phone].

## 2021-12-24 NOTE — Telephone Encounter (Signed)
I called the patient and left a message for the patient to call back to set up an ov appointment. I sent in a 30 day courtesy refill to [facility] on [address].

## 2021-12-24 NOTE — Telephone Encounter (Signed)
error 

## 2021-12-24 NOTE — Telephone Encounter (Signed)
I called the patient and left a message for the patient to call back to set up an appointment. I sent in a 30 day courtesy refill to [facility] on [address].
